# Patient Record
Sex: Male | Born: 1990 | Hispanic: No | Marital: Single | Smoking: Former smoker
Health system: Southern US, Community
[De-identification: ages and names within clinical notes are randomized; demographics above are authoritative.]

## PROBLEM LIST (undated history)

## (undated) DIAGNOSIS — F419 Anxiety disorder, unspecified: Secondary | ICD-10-CM

## (undated) DIAGNOSIS — N189 Chronic kidney disease, unspecified: Secondary | ICD-10-CM

## (undated) DIAGNOSIS — R011 Cardiac murmur, unspecified: Secondary | ICD-10-CM

## (undated) DIAGNOSIS — R51 Headache: Secondary | ICD-10-CM

## (undated) DIAGNOSIS — F319 Bipolar disorder, unspecified: Secondary | ICD-10-CM

## (undated) DIAGNOSIS — F32A Depression, unspecified: Secondary | ICD-10-CM

## (undated) DIAGNOSIS — R519 Headache, unspecified: Secondary | ICD-10-CM

## (undated) DIAGNOSIS — F329 Major depressive disorder, single episode, unspecified: Secondary | ICD-10-CM

## (undated) HISTORY — PX: ORCHIOPEXY: SHX479

## (undated) HISTORY — PX: WISDOM TOOTH EXTRACTION: SHX21

## (undated) HISTORY — PX: OTHER SURGICAL HISTORY: SHX169

---
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
## 2011-01-26 NOTE — Op Note (Signed)
Hayes Hayes           ACCOUNT NO.:  [id number]   MEDICAL RECORD NO.:  [id number]          PATIENT TYPE:  AMB   LOCATION:  DSC                          FACILITY:  [facility]   PHYSICIAN:  Hewitt Blade, D.D.S.DATE OF BIRTH:  17-Mar-1991   DATE OF PROCEDURE:  03/05/2009  DATE OF DISCHARGE:                               OPERATIVE REPORT   PREOPERATIVE DIAGNOSES:  1. Maxillary and mandibular impacted third molar teeth, #1, 16, 17,      32.  2. History of chronic depression and anxiety.  3. History of intraventricular conduction delay.   POSTOPERATIVE DIAGNOSES:  1. Maxillary and mandibular impacted third molar teeth, #1, 16, 17,      32.  2. History of chronic depression and anxiety.  3. History of intraventricular conduction delay.   SURGERY PERFORMED:  Removal of third molar teeth #1, 16, 17, and 32.   SURGEON:  Hewitt Blade, DDS   FIRST ASSISTANT:  Estill Batten.   ANESTHESIA:  General via oral endotracheal intubation.   ESTIMATED BLOOD LOSS:  Less than 10 mL.   FLUID REPLACEMENT:  Approximately 1000 mL of crystalloid solutions.   COMPLICATIONS:  None apparent.   INDICATIONS FOR PROCEDURE:  Hayes Hayes is a 20 year old gentleman who  is referred to my office for evaluation and removal of his third molar  teeth #1, 16, 17, and 32.  Upon evaluation of his medical history, Hayes Hayes is currently being treated with multiple psychotropic  medications to control anxiety, depression, and suicidal attempts.  Following consultation with his physician and psychiatrist, cessation of  the medications would be detrimental to his health and also with the  intravenous conduction delay, it was recommended that along with the  medications taking, the procedure should be carried out in a hospital  setting with continuous monitoring by the anesthesiologist.   DETAILS OF PROCEDURE:  On March 05, 2009, Hayes Hayes was taken to [facility] where he  was placed on the operating room  table in a supine position.  Following successful general anesthesia and  oral intubation, the patient's face, neck, and oral cavity were prepped  and draped in the usual sterile operating room fashion.  The hypopharynx  was suctioned free of fluids and secretions, and a moistened 2-inch  vaginal pack was placed as a drape pack.   Attention was then directed intraorally, where approximately 8 mL of  0.5% Xylocaine containing 1:200,000 epinephrine were infiltrated in the  right and left maxillary, posterior superior alveolar nerve  distributions, corresponding palatal soft tissues, and right left  inferior alveolar neurovascular regions.   Attention was then directed towards the right maxillary arch, where a  #15 Bard-Parker blade was used to create a 1.5 curvilinear incision  through the mucosal tissues over the distal alveolus.  A full-thickness  mucoperiosteal flap was then elevated using a #9 periosteal elevator and  the overlying cortical bone was exposed.  The bone was then reduced  using a Stryker roto-osteotome with a #8 round bur and a periosteal  elevator.  Embedded tooth #1 was identified and then subluxated from the  alveolus using a 301 elevator and removed from the oral cavity using  rongeurs and forceps.  The surrounding dental follicular tissue was  curetted using a double-ended mouth curette and removed with rongeurs  and forceps.  The bony margins were then smoothed and contoured with a  small osseous file.  Surgical site was then copiously irrigated with  sterile saline irrigating solutions and suctioned.  The mucoperiosteal  margins were then approximated and sutured in an interrupted fashion  using 4-0 chromic suture material.  In a similar fashion, tooth #16 was  removed as well.   Tooth #17 was then approached using a #15 Bard-Parker blade with a 1.5  curvilinear incision being made through the mucosal tissues.  Mucoperiosteal  flap was then elevated laterally and inferiorly using a  #9 periosteal elevator.  The overlying cortical bone was then reduced  using a Stryker roto-osteotome and a #8 round bur.  Tooth #17 was then  suctioned in its vertical axis using a Stryker roto-osteotome and a 107  fissure bur.  The tooth was then subluxated from the alveolus using a  301 elevator and the suctions were removed using rongeurs and forceps.  The surrounding dental follicular tissue was then curetted and removed  using a double-ended mouth curette.  The bony margins were then smoothed  and contoured using the Stryker roto-osteotome and a #8 round bur.  The  area was then thoroughly irrigated with sterile saline irrigating  solution and suctioned.  A Gelfoam/Maxitrol pack was then placed into  the extraction site.  The mucoperiosteal margins were then approximated  and sutured in an interrupted fashion using 4-0 chromic suture material.  In a similar fashion, tooth #32 was removed.   The oral cavity was then thoroughly irrigated with sterile saline  irrigating solution and suctioned using a Yankauer suction.  The throat  pack was removed, and the hypopharynx suctioned free of fluids and  secretions.  4x4 gauzes were then placed to control oozing.  The patient  was then allowed to awaken from the anesthesia and taken to the recovery  room where he tolerated the procedure well and without apparent  complications.           ______________________________  Hewitt Blade, D.D.S.     DC/MEDQ  D:  03/05/2009  T:  03/06/2009  Job:  [id number]   cc:   Holley Bouche, M.D.
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
## 2011-06-18 LAB — DIFFERENTIAL
Basophils Absolute: 0 10*3/uL (ref 0.0–0.1)
Basophils Relative: 1 % (ref 0–1)
Eosinophils Absolute: 0.4 10*3/uL (ref 0.0–1.2)
Monocytes Absolute: 0.6 10*3/uL (ref 0.2–1.2)
Monocytes Relative: 10 % (ref 3–11)
Neutro Abs: 2.3 10*3/uL (ref 1.7–8.0)

## 2011-06-18 LAB — CBC
Hemoglobin: 13.3 g/dL (ref 12.0–16.0)
MCHC: 33.8 g/dL (ref 31.0–37.0)
MCV: 90.8 fL (ref 78.0–98.0)
RDW: 12.7 % (ref 11.4–15.5)

## 2011-06-18 LAB — DRUGS OF ABUSE SCREEN W/O ALC, ROUTINE URINE
Cocaine Metabolites: NEGATIVE
Phencyclidine (PCP): NEGATIVE
Propoxyphene: NEGATIVE

## 2011-06-18 LAB — HEPATIC FUNCTION PANEL
ALT: 12 U/L (ref 0–53)
Alkaline Phosphatase: 78 U/L (ref 52–171)
Bilirubin, Direct: 0.1 mg/dL (ref 0.0–0.3)
Indirect Bilirubin: 0.5 mg/dL (ref 0.3–0.9)
Total Bilirubin: 0.6 mg/dL (ref 0.3–1.2)
Total Protein: 6.2 g/dL (ref 6.0–8.3)

## 2011-06-18 LAB — LIPID PANEL
LDL Cholesterol: 88 mg/dL (ref 0–109)
Total CHOL/HDL Ratio: 3 RATIO
VLDL: 13 mg/dL (ref 0–40)

## 2011-06-18 LAB — HEMOGLOBIN A1C: Hgb A1c MFr Bld: 5.6 % (ref 4.6–6.1)

## 2011-06-18 LAB — URINALYSIS, ROUTINE W REFLEX MICROSCOPIC
Ketones, ur: NEGATIVE mg/dL
Nitrite: NEGATIVE
Protein, ur: NEGATIVE mg/dL

## 2011-06-18 LAB — RPR: RPR Ser Ql: NONREACTIVE

## 2011-06-18 LAB — BASIC METABOLIC PANEL
CO2: 28 mEq/L (ref 19–32)
Calcium: 9.6 mg/dL (ref 8.4–10.5)
Chloride: 105 mEq/L (ref 96–112)
Creatinine, Ser: 1.03 mg/dL (ref 0.4–1.5)
Glucose, Bld: 80 mg/dL (ref 70–99)
Sodium: 140 mEq/L (ref 135–145)

## 2011-06-18 LAB — TSH: TSH: 2.797 u[IU]/mL (ref 0.350–4.500)

## 2013-08-21 ENCOUNTER — Ambulatory Visit: Payer: Self-pay | Admitting: *Deleted

## 2013-12-10 ENCOUNTER — Other Ambulatory Visit: Payer: Self-pay | Admitting: Family Medicine

## 2013-12-10 ENCOUNTER — Ambulatory Visit
Admission: RE | Admit: 2013-12-10 | Discharge: 2013-12-10 | Disposition: A | Discharge status: Home / Self Care | Payer: Federal, State, Local not specified - PPO | Source: Ambulatory Visit | Attending: Family Medicine | Admitting: Family Medicine

## 2013-12-10 DIAGNOSIS — R310 Gross hematuria: Secondary | ICD-10-CM

## 2014-09-04 ENCOUNTER — Ambulatory Visit
Admission: RE | Admit: 2014-09-04 | Discharge: 2014-09-04 | Disposition: A | Discharge status: Home / Self Care | Payer: Federal, State, Local not specified - PPO | Source: Ambulatory Visit | Attending: Family Medicine | Admitting: Family Medicine

## 2014-09-04 ENCOUNTER — Other Ambulatory Visit: Payer: Self-pay | Admitting: Family Medicine

## 2014-09-04 DIAGNOSIS — R319 Hematuria, unspecified: Secondary | ICD-10-CM

## 2014-09-04 DIAGNOSIS — R109 Unspecified abdominal pain: Secondary | ICD-10-CM

## 2014-09-05 ENCOUNTER — Emergency Department (HOSPITAL_COMMUNITY): Payer: Federal, State, Local not specified - PPO | Admitting: Certified Registered Nurse Anesthetist

## 2014-09-05 ENCOUNTER — Ambulatory Visit: Admit: 2014-09-05 | Payer: Federal, State, Local not specified - PPO | Admitting: Urology

## 2014-09-05 ENCOUNTER — Encounter (HOSPITAL_COMMUNITY): Payer: Self-pay | Admitting: Emergency Medicine

## 2014-09-05 ENCOUNTER — Emergency Department (HOSPITAL_COMMUNITY)
Admission: EM | Admit: 2014-09-05 | Discharge: 2014-09-05 | Disposition: A | Discharge status: Home / Self Care | Payer: Federal, State, Local not specified - PPO | Attending: Urology | Admitting: Urology

## 2014-09-05 ENCOUNTER — Encounter (HOSPITAL_COMMUNITY)
Admission: EM | Disposition: A | Discharge status: Home / Self Care | Payer: Self-pay | Source: Home / Self Care | Attending: Emergency Medicine

## 2014-09-05 DIAGNOSIS — F319 Bipolar disorder, unspecified: Secondary | ICD-10-CM | POA: Diagnosis not present

## 2014-09-05 DIAGNOSIS — F909 Attention-deficit hyperactivity disorder, unspecified type: Secondary | ICD-10-CM | POA: Diagnosis not present

## 2014-09-05 DIAGNOSIS — R310 Gross hematuria: Secondary | ICD-10-CM | POA: Insufficient documentation

## 2014-09-05 DIAGNOSIS — N132 Hydronephrosis with renal and ureteral calculous obstruction: Secondary | ICD-10-CM | POA: Diagnosis not present

## 2014-09-05 DIAGNOSIS — N200 Calculus of kidney: Secondary | ICD-10-CM | POA: Diagnosis present

## 2014-09-05 DIAGNOSIS — N23 Unspecified renal colic: Secondary | ICD-10-CM

## 2014-09-05 HISTORY — PX: CYSTOSCOPY WITH RETROGRADE PYELOGRAM, URETEROSCOPY AND STENT PLACEMENT: SHX5789

## 2014-09-05 LAB — CBC WITH DIFFERENTIAL/PLATELET
BASOS PCT: 0 % (ref 0–1)
Basophils Absolute: 0 10*3/uL (ref 0.0–0.1)
EOS ABS: 0.1 10*3/uL (ref 0.0–0.7)
Eosinophils Relative: 1 % (ref 0–5)
HEMATOCRIT: 44.3 % (ref 39.0–52.0)
HEMOGLOBIN: 15.2 g/dL (ref 13.0–17.0)
Lymphocytes Relative: 20 % (ref 12–46)
Lymphs Abs: 2 10*3/uL (ref 0.7–4.0)
MCH: 30.3 pg (ref 26.0–34.0)
MCHC: 34.3 g/dL (ref 30.0–36.0)
MCV: 88.4 fL (ref 78.0–100.0)
MONO ABS: 1.3 10*3/uL — AB (ref 0.1–1.0)
MONOS PCT: 13 % — AB (ref 3–12)
NEUTROS ABS: 6.4 10*3/uL (ref 1.7–7.7)
Neutrophils Relative %: 66 % (ref 43–77)
Platelets: 265 10*3/uL (ref 150–400)
RBC: 5.01 MIL/uL (ref 4.22–5.81)
RDW: 12.7 % (ref 11.5–15.5)
WBC: 9.7 10*3/uL (ref 4.0–10.5)

## 2014-09-05 LAB — BASIC METABOLIC PANEL
Anion gap: 7 (ref 5–15)
BUN: 13 mg/dL (ref 6–23)
CHLORIDE: 104 meq/L (ref 96–112)
CO2: 25 mmol/L (ref 19–32)
CREATININE: 1.4 mg/dL — AB (ref 0.50–1.35)
Calcium: 9.4 mg/dL (ref 8.4–10.5)
GFR calc non Af Amer: 70 mL/min — ABNORMAL LOW (ref >90)
GFR, EST AFRICAN AMERICAN: 81 mL/min — AB (ref >90)
Glucose, Bld: 137 mg/dL — ABNORMAL HIGH (ref 70–99)
Potassium: 4.2 mmol/L (ref 3.5–5.1)
Sodium: 136 mmol/L (ref 135–145)

## 2014-09-05 SURGERY — CYSTOURETEROSCOPY, WITH RETROGRADE PYELOGRAM AND STENT INSERTION
Anesthesia: General | Laterality: Right

## 2014-09-05 MED ORDER — PROMETHAZINE HCL 25 MG/ML IJ SOLN
6.2500 mg | INTRAMUSCULAR | Status: DC | PRN
  Filled 2014-09-05: qty 1

## 2014-09-05 MED ORDER — PROPOFOL 10 MG/ML IV BOLUS
INTRAVENOUS | Status: AC
  Filled 2014-09-05: qty 20

## 2014-09-05 MED ORDER — FENTANYL CITRATE 0.05 MG/ML IJ SOLN
INTRAMUSCULAR | Status: DC | PRN
  Administered 2014-09-05 (×2): 50 ug via INTRAVENOUS

## 2014-09-05 MED ORDER — LACTATED RINGERS IV SOLN: INTRAVENOUS | Status: DC

## 2014-09-05 MED ORDER — FENTANYL CITRATE 0.05 MG/ML IJ SOLN: 25.0000 ug | INTRAMUSCULAR | Status: DC | PRN

## 2014-09-05 MED ORDER — CEFAZOLIN SODIUM-DEXTROSE 2-3 GM-% IV SOLR
INTRAVENOUS | Status: AC
  Filled 2014-09-05: qty 50

## 2014-09-05 MED ORDER — HYDROMORPHONE HCL 1 MG/ML IJ SOLN
2.0000 mg | INTRAMUSCULAR | Status: DC | PRN
  Administered 2014-09-05: 2 mg via INTRAVENOUS
  Filled 2014-09-05: qty 2

## 2014-09-05 MED ORDER — LACTATED RINGERS IV SOLN: INTRAVENOUS | Status: DC | PRN

## 2014-09-05 MED ORDER — KETOROLAC TROMETHAMINE 30 MG/ML IJ SOLN
INTRAMUSCULAR | Status: DC | PRN
  Administered 2014-09-05: 30 mg via INTRAVENOUS

## 2014-09-05 MED ORDER — BELLADONNA ALKALOIDS-OPIUM 16.2-60 MG RE SUPP
RECTAL | Status: DC | PRN
  Administered 2014-09-05: 1 via RECTAL

## 2014-09-05 MED ORDER — IOHEXOL 300 MG/ML  SOLN
INTRAMUSCULAR | Status: DC | PRN
  Administered 2014-09-05: 10 mL

## 2014-09-05 MED ORDER — CEFAZOLIN SODIUM-DEXTROSE 2-3 GM-% IV SOLR
INTRAVENOUS | Status: DC | PRN
  Administered 2014-09-05: 2 g via INTRAVENOUS

## 2014-09-05 MED ORDER — DEXAMETHASONE SODIUM PHOSPHATE 10 MG/ML IJ SOLN
INTRAMUSCULAR | Status: AC
  Filled 2014-09-05: qty 1

## 2014-09-05 MED ORDER — OXYCODONE-ACETAMINOPHEN 5-325 MG PO TABS: 1.0000 | ORAL_TABLET | ORAL | Status: DC | PRN

## 2014-09-05 MED ORDER — MEPERIDINE HCL 50 MG/ML IJ SOLN: 6.2500 mg | INTRAMUSCULAR | Status: DC | PRN

## 2014-09-05 MED ORDER — MIDAZOLAM HCL 5 MG/5ML IJ SOLN
INTRAMUSCULAR | Status: DC | PRN
  Administered 2014-09-05: 2 mg via INTRAVENOUS

## 2014-09-05 MED ORDER — STERILE WATER FOR IRRIGATION IR SOLN
Status: DC | PRN
  Administered 2014-09-05: 3000 mL

## 2014-09-05 MED ORDER — PROPOFOL 10 MG/ML IV BOLUS
INTRAVENOUS | Status: DC | PRN
  Administered 2014-09-05: 200 mg via INTRAVENOUS

## 2014-09-05 MED ORDER — ACETAMINOPHEN 10 MG/ML IV SOLN
1000.0000 mg | Freq: Once | INTRAVENOUS | Status: AC
  Administered 2014-09-05: 1000 mg via INTRAVENOUS
  Filled 2014-09-05: qty 100

## 2014-09-05 MED ORDER — MIDAZOLAM HCL 2 MG/2ML IJ SOLN
INTRAMUSCULAR | Status: AC
  Filled 2014-09-05: qty 2

## 2014-09-05 MED ORDER — LIDOCAINE HCL (CARDIAC) 20 MG/ML IV SOLN
INTRAVENOUS | Status: DC | PRN
  Administered 2014-09-05: 100 mg via INTRAVENOUS

## 2014-09-05 MED ORDER — SUCCINYLCHOLINE CHLORIDE 20 MG/ML IJ SOLN
INTRAMUSCULAR | Status: DC | PRN
  Administered 2014-09-05: 100 mg via INTRAVENOUS

## 2014-09-05 MED ORDER — ONDANSETRON HCL 4 MG/2ML IJ SOLN
INTRAMUSCULAR | Status: DC | PRN
  Administered 2014-09-05: 4 mg via INTRAVENOUS

## 2014-09-05 MED ORDER — KETOROLAC TROMETHAMINE 30 MG/ML IJ SOLN
INTRAMUSCULAR | Status: AC
  Filled 2014-09-05: qty 1

## 2014-09-05 MED ORDER — ONDANSETRON HCL 4 MG/2ML IJ SOLN
INTRAMUSCULAR | Status: AC
  Filled 2014-09-05: qty 2

## 2014-09-05 MED ORDER — FENTANYL CITRATE 0.05 MG/ML IJ SOLN
INTRAMUSCULAR | Status: AC
  Filled 2014-09-05: qty 5

## 2014-09-05 MED ORDER — LIDOCAINE HCL (CARDIAC) 20 MG/ML IV SOLN
INTRAVENOUS | Status: AC
  Filled 2014-09-05: qty 5

## 2014-09-05 MED ORDER — ROCURONIUM BROMIDE 100 MG/10ML IV SOLN
INTRAVENOUS | Status: DC | PRN
  Administered 2014-09-05: 5 mg via INTRAVENOUS

## 2014-09-05 MED ORDER — ONDANSETRON HCL 4 MG/2ML IJ SOLN
4.0000 mg | Freq: Four times a day (QID) | INTRAMUSCULAR | Status: DC | PRN
  Administered 2014-09-05: 4 mg via INTRAVENOUS
  Filled 2014-09-05: qty 2

## 2014-09-05 MED ORDER — SODIUM CHLORIDE 0.9 % IV SOLN
INTRAVENOUS | Status: DC | PRN
  Administered 2014-09-05: 19:00:00 via INTRAVENOUS

## 2014-09-05 MED ORDER — URIBEL 118 MG PO CAPS: 1.0000 | ORAL_CAPSULE | Freq: Three times a day (TID) | ORAL | Status: DC | PRN

## 2014-09-05 MED ORDER — SODIUM CHLORIDE 0.9 % IV SOLN
INTRAVENOUS | Status: DC
  Administered 2014-09-05: 13:00:00 via INTRAVENOUS

## 2014-09-05 MED ORDER — BELLADONNA ALKALOIDS-OPIUM 16.2-60 MG RE SUPP
RECTAL | Status: AC
  Filled 2014-09-05: qty 1

## 2014-09-05 MED ORDER — DEXAMETHASONE SODIUM PHOSPHATE 10 MG/ML IJ SOLN
INTRAMUSCULAR | Status: DC | PRN
  Administered 2014-09-05: 10 mg via INTRAVENOUS

## 2014-09-05 SURGICAL SUPPLY — 13 items
BAG URO CATCHER STRL LF (DRAPE) ×2 IMPLANT
CATH INTERMIT  6FR 70CM (CATHETERS) ×2 IMPLANT
CLOTH BEACON ORANGE TIMEOUT ST (SAFETY) ×2 IMPLANT
DRAPE CAMERA CLOSED 9X96 (DRAPES) ×2 IMPLANT
GLOVE BIOGEL M STRL SZ7.5 (GLOVE) ×2 IMPLANT
GOWN STRL REUS W/TWL XL LVL3 (GOWN DISPOSABLE) ×2 IMPLANT
GUIDEWIRE STR DUAL SENSOR (WIRE) ×2 IMPLANT
MANIFOLD NEPTUNE II (INSTRUMENTS) ×2 IMPLANT
NS IRRIG 1000ML POUR BTL (IV SOLUTION) ×2 IMPLANT
PACK CYSTO (CUSTOM PROCEDURE TRAY) ×2 IMPLANT
SCRUB PCMX 4 OZ (MISCELLANEOUS) IMPLANT
STENT CONTOUR 6FRX24X.038 (STENTS) ×2 IMPLANT
TUBING CONNECTING 10 (TUBING) ×2 IMPLANT

## 2014-09-05 NOTE — H&P (Signed)
History and Physical  Chief Complaint:  Gross hematuria and right sided kidney stone  History of Present Illness:   The patient is a 23 year old male, with a one-week history of gross hematuria, and intermittent right flank pain. Patient was seen by his primary care physician, with CT scan and [place] showing a 1 cm right upper ureteral calculus with obstruction and proximal hydroureteronephrosis. He was referred to a no vomit urologist in [place], and was given an appointment for Monday for follow-up. However, the patient's father desired for the patient to have evaluation sooner than that, and the patient's personal physician called [facility] for evaluation. After evaluation of CT scan, the patient was advised to be seen [facility] emergency room. It is noted that this morning he had severe renal colic, with nausea and vomiting. He is taken hydrocodone today, but did eat a small amount of 11 AM.  His past history is significant for ADHD and bipolar disorder. 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   PMH: ADHD, Bipolar.    History reviewed. No pertinent past surgical history.  Medications: I have reviewed the patient's current medications. Allergies: No Known Allergies  No family history on file. Social History:  has no tobacco, alcohol, and drug history on file.  ROS: All systems are reviewed and negative except as noted. Right flank pain. No RLQ pain.  N/V this AM.   Physical Exam:  Vital signs in last 24 hours: Temp:  [98.1 F (36.7 C)] 98.1 F (36.7 C) (12/24 1123) Pulse Rate:  [71-75] 75 (12/24 1304) Resp:  [16-18] 18 (12/24 1304) BP: (128-137)/(57-71) 137/71 mmHg (12/24 1304) SpO2:  [96 %-100 %] 100 % (12/24 1304)  Cardiovascular: Skin warm; not flushed Respiratory: Breaths quiet; no shortness of breath Abdomen: No masses Neurological: Normal sensation to touch Musculoskeletal: Normal motor function arms  and legs Lymphatics: No inguinal adenopathy Skin: No rashes Genitourinary: normal.    Laboratory Data:  Results for orders placed or performed during the hospital encounter of 09/05/14 (from the past 24 hour(s))  CBC with Differential     Status: Abnormal   Collection Time: 09/05/14 12:55 PM  Result Value Ref Range   WBC 9.7 4.0 - 10.5 K/uL   RBC 5.01 4.22 - 5.81 MIL/uL   Hemoglobin 15.2 13.0 - 17.0 g/dL   HCT 40.944.3 81.139.0 - 91.452.0 %   MCV 88.4 78.0 - 100.0 fL   MCH 30.3 26.0 - 34.0 pg   MCHC 34.3 30.0 - 36.0 g/dL   RDW 78.212.7 95.611.5 - 21.315.5 %   Platelets 265 150 - 400 K/uL   Neutrophils Relative % 66 43 - 77 %   Neutro Abs 6.4 1.7 - 7.7 K/uL   Lymphocytes Relative 20 12 - 46 %   Lymphs Abs 2.0 0.7 - 4.0 K/uL   Monocytes Relative 13 (H) 3 - 12 %   Monocytes Absolute 1.3 (H) 0.1 - 1.0 K/uL   Eosinophils Relative 1 0 - 5 %   Eosinophils Absolute 0.1 0.0 - 0.7 K/uL   Basophils Relative 0 0 - 1 %   Basophils Absolute 0.0 0.0 - 0.1 K/uL  Basic metabolic panel     Status: Abnormal   Collection Time: 09/05/14 12:55 PM  Result Value Ref Range   Sodium 136 135 - 145 mmol/L   Potassium 4.2 3.5 - 5.1 mmol/L   Chloride 104 96 - 112 mEq/L   CO2 25 19 - 32  mmol/L   Glucose, Bld 137 (H) 70 - 99 mg/dL   BUN 13 6 - 23 mg/dL   Creatinine, Ser 1.611.40 (H) 0.50 - 1.35 mg/dL   Calcium 9.4 8.4 - 09.610.5 mg/dL   GFR calc non Af Amer 70 (L) >90 mL/min   GFR calc Af Amer 81 (L) >90 mL/min   Anion gap 7 5 - 15   No results found for this or any previous visit (from the past 240 hour(s)). Creatinine:  Recent Labs  09/05/14 1255  CREATININE 1.40*    Xrays: CLINICAL DATA: Right-sided flank pain for 5 days. History of microscopic hematuria.  EXAM: CT ABDOMEN AND PELVIS WITHOUT CONTRAST  TECHNIQUE: Multidetector CT imaging of the abdomen and pelvis was performed following the standard protocol without IV contrast.  COMPARISON: None.  FINDINGS: The lack of intravenous contrast limits the  ability to evaluate solid abdominal organs.  There is a punctate (approximately 1.0 x 0.7 cm) stone within the superior aspect of the right ureter (coronal image 68, series 400) which results in moderate upstream ureterectasis and pelvicaliectasis. No additional renal stones identified. No evidence of left-sided urinary obstruction. There is a minimal amount of asymmetric left-sided periureteral stranding. Normal noncontrast appearance of the urinary bladder given degree distention.  Normal hepatic contour. Normal noncontrast appearance of the gallbladder. No radiopaque gallstones. No ascites.  Normal noncontrast appearance of the bilateral adrenal glands, pancreas and spleen.  Moderate to large colonic stool burden without evidence of enteric obstruction. Normal noncontrast appearance of the appendix. No pneumoperitoneum, pneumatosis or portal venous gas.  Normal caliber of the abdominal aorta.  No bulky retroperitoneal, mesenteric, pelvic or inguinal lymphadenopathy on this noncontrast examination.  Normal noncontrast appearance of the pelvic organs. No free fluid in the pelvic cul-de-sac.  Limited visualization of the lower thorax demonstrates minimal subsegmental atelectasis within the imaged right lower lobe. No focal airspace opacities. No pleural effusion.  Normal heart size. No pericardial effusion.  No acute or aggressive osseous abnormalities. Regional soft tissues appear normal.  IMPRESSION: 1. An approximately 1 cm stone within the superior aspect of the right ureter results an moderate upstream right-sided ureterectasis and pelvicaliectasis. 2. No additional renal stones identified. No evidence of left-sided urinary obstruction. These results will be called to the ordering clinician or representative by the Radiologist Assistant, and communication documented in the PACS or zVision Dashboard.   Electronically Signed  By: Simonne ComeJohn Watts M.D.   On: 09/04/2014 17:49  Impression/Assessment:  Right upper ureteral stone, 1.0 cm, with proximal hydroureteronephrosis. 841 HU.  I discussed this with the patient, and his father. Note that he has recurrent colic, with history of gross hematuria. I advised him to have right-sided double-J stent placement today as an outpatient. He will then follow-up with K[facility], and lithotripsy next week.  Plan:    Cystoscopy and Right JJ stent today.   [place] Welcome I 09/05/2014, 1:43 PM

## 2014-09-05 NOTE — ED Notes (Signed)
Called OR and checked on  Surgery schedule. They replied that surgery will be after 7 pm today.

## 2014-09-05 NOTE — Anesthesia Postprocedure Evaluation (Signed)
  Anesthesia Post-op Note  Patient: Tony Hayes  Procedure(s) Performed: Procedure(s) (LRB): CYSTOSCOPY WITH RIGHT RETROGRADE PYELOGRAM AND STENT PLACEMENT (Right)  Patient Location: PACU  Anesthesia Type: General  Level of Consciousness: awake and alert   Airway and Oxygen Therapy: Patient Spontanous Breathing  Post-op Pain: mild  Post-op Assessment: Post-op Vital signs reviewed, Patient's Cardiovascular Status Stable, Respiratory Function Stable, Patent Airway and No signs of Nausea or vomiting  Last Vitals:  Filed Vitals:   09/05/14 2110  BP:   Pulse: 64  Temp: 36.7 C  Resp: 12    Post-op Vital Signs: stable   Complications: No apparent anesthesia complications

## 2014-09-05 NOTE — ED Notes (Signed)
Pt reports right flank pain for 1 week for which pt saw urologist and Dx with kidney stones. Pt also reports urinary difficulties and hematuria.

## 2014-09-05 NOTE — Progress Notes (Signed)
PACU note---pt up to BR, voided, IV d/c'ed

## 2014-09-05 NOTE — Progress Notes (Signed)
  CARE MANAGEMENT ED NOTE 09/05/2014  Patient:  Tony Hayes,Tony Hayes   Account Number:  [id number]  Date Initiated:  09/05/2014  Documentation initiated by:  Radford PaxFERRERO,Kitzia Camus  Subjective/Objective Assessment:   Patient presents to Ed with right sided flank pain/kidney stone     Subjective/Objective Assessment Detail:     Action/Plan:   Action/Plan Detail:   Anticipated DC Date:       Status Recommendation to Physician:   Result of Recommendation:    Other ED Services  Consult Working Plan    DC Planning Services  Other  PCP issues    Choice offered to / List presented to:            Status of service:  Completed, signed off  ED Comments:   ED Comments Detail:  EDCM spoke to patient and hhis family at bedside. Patient confirms he has BCBS insurnace.  Patient's has a new pcp at [facility] but cannot remember his name.  Patient also reports he has seen Dr. Patsi Searsannenbaum at [facility]. System updated.  No further EDCm needs at this time.

## 2014-09-05 NOTE — Discharge Instructions (Signed)

## 2014-09-05 NOTE — Transfer of Care (Signed)
Immediate Anesthesia Transfer of Care Note  Patient: Tony BossJonathan K Cotterill  Procedure(s) Performed: Procedure(s): CYSTOSCOPY WITH RIGHT RETROGRADE PYELOGRAM AND STENT PLACEMENT (Right)  Patient Location: PACU  Anesthesia Type:General  Level of Consciousness: awake, alert  and patient cooperative  Airway & Oxygen Therapy: Patient Spontanous Breathing and Patient connected to face mask oxygen  Post-op Assessment: Report given to PACU RN, Post -op Vital signs reviewed and stable and Patient moving all extremities X 4  Post vital signs: stable  Complications: No apparent anesthesia complications

## 2014-09-05 NOTE — ED Provider Notes (Signed)
CSN: [id number]     Arrival date & time 09/05/14  1106 History   First MD Initiated Contact with Patient 09/05/14 1155     Chief Complaint  Patient presents with  . Nephrolithiasis     (Consider location/radiation/quality/duration/timing/severity/associated sxs/prior Treatment) HPI Comments: Patient here complaining of one week of right-sided flank pain. Diagnosed with a 1 cm right-sided kidney stone and sent here to be seen by the urologist. States his pain is controlled this time as he took hydromorphone prior to arrival. No vomiting or fever noted.  The history is provided by the patient and a parent.    History reviewed. No pertinent past medical history. History reviewed. No pertinent past surgical history. No family history on file. History  Substance Use Topics  . Smoking status: Not on file  . Smokeless tobacco: Not on file  . Alcohol Use: Not on file    Review of Systems  All other systems reviewed and are negative.     Allergies  Review of patient's allergies indicates not on file.  Home Medications   Prior to Admission medications   Not on File   BP 128/57 mmHg  Pulse 71  Temp(Src) 98.1 F (36.7 C) (Oral)  Resp 16  SpO2 96% Physical Exam  Constitutional: He is oriented to person, place, and time. He appears well-developed and well-nourished.  Non-toxic appearance. No distress.  HENT:  Head: Normocephalic and atraumatic.  Eyes: Conjunctivae, EOM and lids are normal. Pupils are equal, round, and reactive to light.  Neck: Normal range of motion. Neck supple. No tracheal deviation present. No thyroid mass present.  Cardiovascular: Normal rate, regular rhythm and normal heart sounds.  Exam reveals no gallop.   No murmur heard. Pulmonary/Chest: Effort normal and breath sounds normal. No stridor. No respiratory distress. He has no decreased breath sounds. He has no wheezes. He has no rhonchi. He has no rales.  Abdominal: Soft. Normal appearance and bowel  sounds are normal. He exhibits no distension. There is no tenderness. There is no rebound and no CVA tenderness.  Musculoskeletal: Normal range of motion. He exhibits no edema or tenderness.  Neurological: He is alert and oriented to person, place, and time. He has normal strength. No cranial nerve deficit or sensory deficit. GCS eye subscore is 4. GCS verbal subscore is 5. GCS motor subscore is 6.  Skin: Skin is warm and dry. No abrasion and no rash noted.  Psychiatric: He has a normal mood and affect. His speech is normal and behavior is normal.  Nursing note and vitals reviewed.   ED Course  Procedures (including critical care time) Labs Review Labs Reviewed  CBC WITH DIFFERENTIAL  BASIC METABOLIC PANEL    Imaging Review Ct Abdomen Pelvis Wo Contrast  09/04/2014   CLINICAL DATA:  Right-sided flank pain for 5 days. History of microscopic hematuria.  EXAM: CT ABDOMEN AND PELVIS WITHOUT CONTRAST  TECHNIQUE: Multidetector CT imaging of the abdomen and pelvis was performed following the standard protocol without IV contrast.  COMPARISON:  None.  FINDINGS: The lack of intravenous contrast limits the ability to evaluate solid abdominal organs.  There is a punctate (approximately 1.0 x 0.7 cm) stone within the superior aspect of the right ureter (coronal image 68, series 400) which results in moderate upstream ureterectasis and pelvicaliectasis. No additional renal stones identified. No evidence of left-sided urinary obstruction. There is a minimal amount of asymmetric left-sided periureteral stranding. Normal noncontrast appearance of the urinary bladder given degree distention.  Normal hepatic  contour. Normal noncontrast appearance of the gallbladder. No radiopaque gallstones. No ascites.  Normal noncontrast appearance of the bilateral adrenal glands, pancreas and spleen.  Moderate to large colonic stool burden without evidence of enteric obstruction. Normal noncontrast appearance of the appendix. No  pneumoperitoneum, pneumatosis or portal venous gas.  Normal caliber of the abdominal aorta.  No bulky retroperitoneal, mesenteric, pelvic or inguinal lymphadenopathy on this noncontrast examination.  Normal noncontrast appearance of the pelvic organs. No free fluid in the pelvic cul-de-sac.  Limited visualization of the lower thorax demonstrates minimal subsegmental atelectasis within the imaged right lower lobe. No focal airspace opacities. No pleural effusion.  Normal heart size.  No pericardial effusion.  No acute or aggressive osseous abnormalities. Regional soft tissues appear normal.  IMPRESSION: 1. An approximately 1 cm stone within the superior aspect of the right ureter results an moderate upstream right-sided ureterectasis and pelvicaliectasis. 2. No additional renal stones identified. No evidence of left-sided urinary obstruction. These results will be called to the ordering clinician or representative by the Radiologist Assistant, and communication documented in the PACS or zVision Dashboard.   Electronically Signed   By: Simonne ComeJohn  Watts M.D.   On: 09/04/2014 17:49     EKG Interpretation None      MDM   Final diagnoses:  None     Preoperative blood work ordered here. Patient's pain is controlled he does not require any medications. Spoke with the urologist on call, Dr. Patsi Searsannenbaum, will come to see the patient taken to the operating theater    Toy BakerAnthony T Tayven Renteria, MD 09/05/14 1223

## 2014-09-05 NOTE — Anesthesia Preprocedure Evaluation (Addendum)

## 2014-09-05 NOTE — ED Notes (Addendum)
Per Patsi Searsannenbaum MD, Pt will be going to surgery around 5:30.  Pt will not be admitted, so he will have to hold in the department.

## 2014-09-05 NOTE — Op Note (Signed)
Pre-operative diagnosis :   Impacted right upper ureteral calculus with proximal hydroureteronephrosis, and recurrent renal colic  Postoperative diagnosis:  Same  Operation:  Cystourethroscopy, right retrograde pyelogram interpretation, right double-J stent (6 [place]French by 24 cm).  Surgeon:  Kathie RhodesS. Patsi Searsannenbaum, MD  First assistant:  None  Anesthesia:  General LMA  Preparation:  After appropriate preanesthesia, the patient was brought the operating room, placed on the operating table in the dorsal supine position where general LMA anesthesia was introduced. He was replaced in the dorsal lithotomy position where the pubis was prepped with Betadine solution and draped in usual fashion. The right arm was previously marked. The history was double checked. X-rays had been previously double checked.  Review history:   History of Present Illness: The patient is a 23 year old male, with a one-week history of gross hematuria, and intermittent right flank pain. Patient was seen by his primary care physician, [facility] showing a 1 cm right upper ureteral calculus with obstruction and proximal hydroureteronephrosis. He was referred to [place], and was given an appointment for Monday for follow-up. However, the patient's father desired for the patient to have evaluation sooner than that, and the patient's personal physician called [facility] for evaluation. After evaluation of CT scan, the patient was advised to be [facility]. It is noted that this morning he had severe renal colic, with nausea and vomiting. He is taken hydrocodone today, but did eat a small amount of 11 AM.  Statement of  Likelihood of Success: Excellent. TIME-OUT observed.:  Procedure:  Cystourethroscopy was accomplished, and the bladder appeared normal. The trigone was normal. The ureteral orifices were in normal position on the trigone. There was no evidence of bladder  stone, tumor, or bladder diverticulum.  The 5 [place]French open-ended catheter was then placed within the right ureter, and retrograde pyelography under medication showed the right upper ureteral stone. A 0.038 guidewire was then passed around the stone, with some difficulty, and passed into the renal pelvis and coiled. The open-ended catheter was then removed, and a 6 [place] by 24 cm double-J stent was passed, again with some difficulty around the stone, and coiled in the renal pelvis. Photodocumentation was accomplished with radiographic control.  The patient received IV antibiotic, as well as be an dose suppository, IV Toradol, and IV Tylenol. He was awakened, and taken to recovery room in good condition.

## 2014-09-05 NOTE — Interval H&P Note (Signed)
History and Physical Interval Note:  09/05/2014 2:09 PM  Tony Hayes  has presented today for surgery, with the diagnosis of right renal obstruction, hydronephrosis  The various methods of treatment have been discussed with the patient and family. After consideration of risks, benefits and other options for treatment, the patient has consented to  Procedure(s): CYSTOSCOPY WITH RIGHT RETROGRADE PYELOGRAM AND STENT PLACEMENT (Right) as a surgical intervention .  The patient's history has been reviewed, patient examined, no change in status, stable for surgery.  I have reviewed the patient's chart and labs.  Questions were answered to the patient's satisfaction.   Patient ate biscuit and drank Dr. Reino KentPepper at 11:00 AM. Anesthesia will hold off surgery until 7 pm.   Patsi SearsANNENBAUM, Selim Durden I

## 2014-09-09 ENCOUNTER — Encounter (HOSPITAL_COMMUNITY): Payer: Self-pay | Admitting: Urology

## 2014-09-16 ENCOUNTER — Other Ambulatory Visit: Payer: Self-pay | Admitting: Urology

## 2014-09-17 ENCOUNTER — Encounter (HOSPITAL_COMMUNITY): Payer: Self-pay

## 2014-09-17 NOTE — Progress Notes (Signed)
Pre-lithotripsy:Pt was scheduled to come in to PST on 1/6 0800 for an EKG, his mother states he has a heart condition, she doesn't know name, he takes no meds for it and doesn't see a cardiologist for it.  As a precaution an EKG was scheduled.  Pt's mother is aware to report to Admitting at 0800 1/6.  Documented this also under pt education.  Pt's mother states his pychiatrist diagnosed the heart problem.  Could not find any documentation of the heart problem in Epic or other computer documentation.

## 2014-09-18 ENCOUNTER — Encounter (HOSPITAL_COMMUNITY)
Admission: RE | Admit: 2014-09-18 | Discharge: 2014-09-18 | Disposition: A | Discharge status: Home / Self Care | Payer: Federal, State, Local not specified - PPO | Source: Ambulatory Visit | Attending: Urology | Admitting: Urology

## 2014-09-18 DIAGNOSIS — F418 Other specified anxiety disorders: Secondary | ICD-10-CM | POA: Diagnosis not present

## 2014-09-18 DIAGNOSIS — I10 Essential (primary) hypertension: Secondary | ICD-10-CM | POA: Diagnosis not present

## 2014-09-18 DIAGNOSIS — R631 Polydipsia: Secondary | ICD-10-CM | POA: Diagnosis not present

## 2014-09-18 DIAGNOSIS — N201 Calculus of ureter: Secondary | ICD-10-CM | POA: Diagnosis present

## 2014-09-18 DIAGNOSIS — Z87442 Personal history of urinary calculi: Secondary | ICD-10-CM | POA: Diagnosis not present

## 2014-09-19 ENCOUNTER — Ambulatory Visit (HOSPITAL_COMMUNITY)
Admission: RE | Admit: 2014-09-19 | Discharge: 2014-09-19 | Disposition: A | Discharge status: Home / Self Care | Payer: Federal, State, Local not specified - PPO | Source: Ambulatory Visit | Attending: Urology | Admitting: Urology

## 2014-09-19 ENCOUNTER — Encounter (HOSPITAL_COMMUNITY)
Admission: RE | Disposition: A | Discharge status: Home / Self Care | Payer: Self-pay | Source: Ambulatory Visit | Attending: Urology

## 2014-09-19 ENCOUNTER — Encounter (HOSPITAL_COMMUNITY): Payer: Self-pay | Admitting: *Deleted

## 2014-09-19 ENCOUNTER — Ambulatory Visit (HOSPITAL_COMMUNITY): Payer: Federal, State, Local not specified - PPO

## 2014-09-19 DIAGNOSIS — N201 Calculus of ureter: Secondary | ICD-10-CM | POA: Insufficient documentation

## 2014-09-19 DIAGNOSIS — R631 Polydipsia: Secondary | ICD-10-CM | POA: Insufficient documentation

## 2014-09-19 DIAGNOSIS — Z87442 Personal history of urinary calculi: Secondary | ICD-10-CM | POA: Insufficient documentation

## 2014-09-19 DIAGNOSIS — I10 Essential (primary) hypertension: Secondary | ICD-10-CM | POA: Insufficient documentation

## 2014-09-19 DIAGNOSIS — F418 Other specified anxiety disorders: Secondary | ICD-10-CM | POA: Insufficient documentation

## 2014-09-19 HISTORY — DX: Bipolar disorder, unspecified: F31.9

## 2014-09-19 HISTORY — DX: Anxiety disorder, unspecified: F41.9

## 2014-09-19 HISTORY — DX: Depression, unspecified: F32.A

## 2014-09-19 HISTORY — DX: Cardiac murmur, unspecified: R01.1

## 2014-09-19 HISTORY — DX: Chronic kidney disease, unspecified: N18.9

## 2014-09-19 HISTORY — DX: Major depressive disorder, single episode, unspecified: F32.9

## 2014-09-19 SURGERY — LITHOTRIPSY, ESWL
Anesthesia: LOCAL | Laterality: Right

## 2014-09-19 MED ORDER — DIPHENHYDRAMINE HCL 25 MG PO CAPS
25.0000 mg | ORAL_CAPSULE | Freq: Once | ORAL | Status: AC
  Administered 2014-09-19: 25 mg via ORAL
  Filled 2014-09-19 (×2): qty 1

## 2014-09-19 MED ORDER — CIPROFLOXACIN HCL 500 MG PO TABS
500.0000 mg | ORAL_TABLET | Freq: Once | ORAL | Status: AC
  Administered 2014-09-19: 500 mg via ORAL
  Filled 2014-09-19: qty 1

## 2014-09-19 MED ORDER — DEXTROSE-NACL 5-0.45 % IV SOLN
INTRAVENOUS | Status: DC
Start: 2014-09-19 — End: 2014-09-19
  Administered 2014-09-19: 10:00:00 via INTRAVENOUS

## 2014-09-19 MED ORDER — DIAZEPAM 2 MG PO TABS: 10.0000 mg | ORAL_TABLET | Freq: Once | ORAL | Status: DC

## 2014-09-19 MED ORDER — DIAZEPAM 5 MG PO TABS
10.0000 mg | ORAL_TABLET | Freq: Once | ORAL | Status: AC
  Administered 2014-09-19: 10 mg via ORAL
  Filled 2014-09-19: qty 2

## 2014-09-19 NOTE — Interval H&P Note (Signed)
History and Physical Interval Note:  09/19/2014 8:27 AM  Tony Hayes  has presented today for surgery, with the diagnosis of right upj stone  The various methods of treatment have been discussed with the patient and family. After consideration of risks, benefits and other options for treatment, the patient has consented to  Procedure(s): RIGHT EXTRACORPOREAL SHOCK WAVE LITHOTRIPSY (ESWL) (Right) as a surgical intervention .  The patient's history has been reviewed, patient examined, no change in status, stable for surgery.  I have reviewed the patient's chart and labs.  Questions were answered to the patient's satisfaction.     Jethro BolusANNENBAUM, Kamauri Denardo I

## 2014-09-19 NOTE — H&P (Signed)
Reason For Visit KUB & hospital f/u   Active Problems Problems  1. Right ureteral stone (N20.1)  History of Present Illness 24 yo male presents today for a KUB & to discuss ESWL. He was seen in the ER on 09/05/14 for a 1 week hx of gross hematuria & intermittent Rt flank pain. Patient was originally evaluated by PCP & CT showed a 1cm Rt upper ureteral stone with obstruction. He had a cystoscopy/Rt RPG/Rt JJ stent placed on 09/05/14.   Past Medical History Problems  1. History of Anxiety (F41.9) 2. History of depression (Z86.59) 3. History of hypertension (Z86.79)  Surgical History Problems  1. History of Elective Circumcision 2. History of Surgery Testis 3. History of Surgery Testis Exploration Of Undescended Testis  Current Meds 1. Effexor TABS;  Therapy: (Recorded:30Dec2015) to Recorded 2. LaMICtal TABS;  Therapy: (Recorded:30Dec2015) to Recorded  Allergies Medication  1. No Known Drug Allergies  Social History Problems  1. Caffeine Use  Review of Systems Genitourinary, constitutional, skin, eye, otolaryngeal, hematologic/lymphatic, cardiovascular, pulmonary, endocrine, musculoskeletal, gastrointestinal, neurological and psychiatric system(s) were reviewed and pertinent findings if present are noted and are otherwise negative.  Genitourinary: urinary frequency, difficulty starting the urinary stream, weak urinary stream, hematuria and initiating urination requires straining.  Gastrointestinal: nausea.  Constitutional: feeling tired (fatigue).  ENT: sinus problems.  Endocrine: polydipsia.  Neurological: headache and dizziness.  Psychiatric: anxiety and depression.    Vitals Vital Signs [Data Includes: Last 1 Day]  Recorded: 30Dec2015 11:19AM  Height: 5 ft 10 in Weight: 204 lb  BMI Calculated: 29.27 BSA Calculated: 2.1 Blood Pressure: 122 / 74 Heart Rate: 84  Physical Exam Constitutional: Well nourished and well developed . No acute distress.  ENT:. The  ears and nose are normal in appearance.  Neck: The appearance of the neck is normal and no neck mass is present.  Pulmonary: No respiratory distress and normal respiratory rhythm and effort.  Cardiovascular: Heart rate and rhythm are normal . No peripheral edema.  Abdomen: The abdomen is soft and nontender. No masses are palpated. No CVA tenderness. No hernias are palpable. No hepatosplenomegaly noted.  Genitourinary: Examination of the penis demonstrates no discharge, no masses, no lesions and a normal meatus. The scrotum is without lesions. The right epididymis is palpably normal and non-tender. The left epididymis is palpably normal and non-tender. The right testis is non-tender and without masses. The left testis is non-tender and without masses.  Lymphatics: The femoral and inguinal nodes are not enlarged or tender.  Skin: Normal skin turgor, no visible rash and no visible skin lesions.  Neuro/Psych:. Mood and affect are appropriate.    Results/Data Urine [Data Includes: Last 1 Day]   30Dec2015  COLOR AMBER   APPEARANCE CLOUDY   SPECIFIC GRAVITY 1.025   pH 6.5   GLUCOSE NEG mg/dL  BILIRUBIN SMALL   KETONE NEG mg/dL  BLOOD LARGE   PROTEIN 100 mg/dL  UROBILINOGEN 1 mg/dL  NITRITE NEG   LEUKOCYTE ESTERASE MOD   SQUAMOUS EPITHELIAL/HPF NONE SEEN   WBC NONE SEEN WBC/hpf  RBC TNTC RBC/hpf  BACTERIA RARE   CRYSTALS NONE SEEN   CASTS NONE SEEN    KUB: Right UPJ stone. Seen on KUB. Discussed lithotripsy with pt and his father.   Assessment Assessed  1. Right ureteral stone (N20.1)  Right UPJ stone post Right JJ stent.   Plan Health Maintenance  1. UA With REFLEX; [Do Not Release]; Status:Resulted - Requires  Verification;   Done: 30Dec2015 10:45AM Right ureteral  stone  2. Follow-up Schedule Surgery Office  Follow-up  Status: Hold For -  Appointment  Requested for: 30Dec2015  Lithotripsy of Right UPJ stone. Continue Urobel. He has stent sensation. OK to go to gym.    Signatures Electronically signed by : Jethro BolusSigmund Avrianna Smart, M.D.; Sep 11 2014 11:53AM EST

## 2014-10-16 ENCOUNTER — Other Ambulatory Visit: Payer: Self-pay | Admitting: Urology

## 2014-10-16 NOTE — Progress Notes (Signed)
Called requested to release to sign and held in Epic surgery 10-21-14 pre op 10-18-14 Thanks

## 2014-10-18 ENCOUNTER — Encounter (HOSPITAL_COMMUNITY)
Admission: RE | Admit: 2014-10-18 | Discharge: 2014-10-18 | Disposition: A | Discharge status: Home / Self Care | Payer: Federal, State, Local not specified - PPO | Source: Ambulatory Visit | Attending: Urology | Admitting: Urology

## 2014-10-18 ENCOUNTER — Encounter (HOSPITAL_COMMUNITY): Payer: Self-pay

## 2014-10-18 DIAGNOSIS — F329 Major depressive disorder, single episode, unspecified: Secondary | ICD-10-CM | POA: Diagnosis not present

## 2014-10-18 DIAGNOSIS — F419 Anxiety disorder, unspecified: Secondary | ICD-10-CM | POA: Diagnosis not present

## 2014-10-18 DIAGNOSIS — I10 Essential (primary) hypertension: Secondary | ICD-10-CM | POA: Diagnosis not present

## 2014-10-18 DIAGNOSIS — F17210 Nicotine dependence, cigarettes, uncomplicated: Secondary | ICD-10-CM | POA: Diagnosis not present

## 2014-10-18 DIAGNOSIS — N201 Calculus of ureter: Secondary | ICD-10-CM | POA: Diagnosis not present

## 2014-10-18 HISTORY — DX: Headache, unspecified: R51.9

## 2014-10-18 HISTORY — DX: Headache: R51

## 2014-10-18 LAB — CBC
HCT: 42 % (ref 39.0–52.0)
Hemoglobin: 14.2 g/dL (ref 13.0–17.0)
MCH: 29.8 pg (ref 26.0–34.0)
MCHC: 33.8 g/dL (ref 30.0–36.0)
MCV: 88.2 fL (ref 78.0–100.0)
Platelets: 293 10*3/uL (ref 150–400)
RBC: 4.76 MIL/uL (ref 4.22–5.81)
RDW: 12.5 % (ref 11.5–15.5)
WBC: 10.3 10*3/uL (ref 4.0–10.5)

## 2014-10-18 LAB — BASIC METABOLIC PANEL
Anion gap: 7 (ref 5–15)
BUN: 16 mg/dL (ref 6–23)
CO2: 27 mmol/L (ref 19–32)
Calcium: 9.9 mg/dL (ref 8.4–10.5)
Chloride: 105 mmol/L (ref 96–112)
Creatinine, Ser: 1.19 mg/dL (ref 0.50–1.35)
GFR calc Af Amer: >90 mL/min (ref >90)
GFR, EST NON AFRICAN AMERICAN: 86 mL/min — AB (ref >90)
Glucose, Bld: 95 mg/dL (ref 70–99)
Potassium: 4.9 mmol/L (ref 3.5–5.1)
Sodium: 139 mmol/L (ref 135–145)

## 2014-10-18 NOTE — Progress Notes (Signed)
ekg 1/16  Epic

## 2014-10-18 NOTE — Patient Instructions (Addendum)
Your procedure is scheduled on: 10/21/14  MONDAY   Report to [facility]- FOLLOW SIGNS TO SHORT STAY CENTER Short Stay Center at    1015   AM.   Call this number if you have problems the morning of surgery: [phone]        Do not eat food  Or drink :After Midnight. Sunday night   Take these medicines the morning of surgery with A SIP OF WATER: tamsulosin May take oxycodone, zofran if needed   .  Contacts, dentures or partial plates, or metal hairpins  can not be worn to surgery. Your family will be responsible for glasses, dentures, hearing aides while you are in surgery  Leave suitcase in the car. After surgery it may be brought to your room.  For patients admitted to the hospital, checkout time is 11:00 AM day of  discharge.         [facility] IS NOT RESPONSIBLE FOR ANY VALUABLES  Patients discharged the day of surgery will not be allowed to drive home. IF going home the day of surgery, you must have a driver and someone to stay with you for the first 24 hours  Name and phone number of your driver:    parents                                                                                                                                     [facility] - Preparing for [facility] Before surgery, you can play an important role.  Because skin is not sterile, your skin needs to be as free of germs as possible.  You can reduce the number of germs on your skin by washing with CHG (chlorahexidine gluconate) soap before surgery.  CHG is an antiseptic cleaner which kills germs and bonds with the skin to continue killing germs even after washing. Please DO NOT use if you have an allergy to CHG or antibacterial soaps.  If your skin becomes reddened/irritated stop using the CHG and inform your nurse when you arrive at Short Stay. Do not shave (including legs and underarms) for at least 48 hours prior to the first CHG shower.  You may shave your face/neck. Please follow  these instructions carefully:  1.  Shower with CHG Soap the night before surgery and the  morning of Surgery.  2.  If you choose to wash your hair, wash your hair first as usual with your  normal  shampoo.  3.  After you shampoo, rinse your hair and body thoroughly to remove the  shampoo.                           4.  Use CHG as you would any other liquid soap.  You can apply chg directly  to the skin and wash  Gently with a scrungie or clean washcloth.  5.  Apply the CHG Soap to your body ONLY FROM THE NECK DOWN.   Do not use on face/ open                           Wound or open sores. Avoid contact with eyes, ears mouth and genitals (private parts).                       Wash face,  Genitals (private parts) with your normal soap.             6.  Wash thoroughly, paying special attention to the area where your surgery  will be performed.  7.  Thoroughly rinse your body with warm water from the neck down.  8.  DO NOT shower/wash with your normal soap after using and rinsing off  the CHG Soap.                9.  Pat yourself dry with a clean towel.            10.  Wear clean pajamas.            11.  Place clean sheets on your bed the night of your first shower and do not  sleep with pets. Day of Surgery : Do not apply any lotions/deodorants the morning of surgery.  Please wear clean clothes to the hospital/surgery center.  FAILURE TO FOLLOW THESE INSTRUCTIONS MAY RESULT IN THE CANCELLATION OF YOUR SURGERY PATIENT SIGNATURE_________________________________  NURSE SIGNATURE__________________________________  ________________________________________________________________________

## 2014-10-21 ENCOUNTER — Encounter (HOSPITAL_COMMUNITY): Payer: Self-pay | Admitting: *Deleted

## 2014-10-21 ENCOUNTER — Encounter (HOSPITAL_COMMUNITY)
Admission: RE | Disposition: A | Discharge status: Home / Self Care | Payer: Self-pay | Source: Ambulatory Visit | Attending: Urology

## 2014-10-21 ENCOUNTER — Ambulatory Visit (HOSPITAL_COMMUNITY)
Admission: RE | Admit: 2014-10-21 | Discharge: 2014-10-21 | Disposition: A | Discharge status: Home / Self Care | Payer: Federal, State, Local not specified - PPO | Source: Ambulatory Visit | Attending: Urology | Admitting: Urology

## 2014-10-21 ENCOUNTER — Ambulatory Visit (HOSPITAL_COMMUNITY): Payer: Federal, State, Local not specified - PPO | Admitting: Certified Registered"

## 2014-10-21 DIAGNOSIS — I10 Essential (primary) hypertension: Secondary | ICD-10-CM | POA: Insufficient documentation

## 2014-10-21 DIAGNOSIS — F419 Anxiety disorder, unspecified: Secondary | ICD-10-CM | POA: Insufficient documentation

## 2014-10-21 DIAGNOSIS — N201 Calculus of ureter: Secondary | ICD-10-CM | POA: Insufficient documentation

## 2014-10-21 DIAGNOSIS — F17210 Nicotine dependence, cigarettes, uncomplicated: Secondary | ICD-10-CM | POA: Insufficient documentation

## 2014-10-21 DIAGNOSIS — F329 Major depressive disorder, single episode, unspecified: Secondary | ICD-10-CM | POA: Insufficient documentation

## 2014-10-21 HISTORY — PX: CYSTOSCOPY WITH RETROGRADE PYELOGRAM, URETEROSCOPY AND STENT PLACEMENT: SHX5789

## 2014-10-21 HISTORY — PX: HOLMIUM LASER APPLICATION: SHX5852

## 2014-10-21 SURGERY — CYSTOURETEROSCOPY, WITH RETROGRADE PYELOGRAM AND STENT INSERTION
Anesthesia: General | Laterality: Right

## 2014-10-21 MED ORDER — FENTANYL CITRATE 0.05 MG/ML IJ SOLN
INTRAMUSCULAR | Status: AC
  Filled 2014-10-21: qty 2

## 2014-10-21 MED ORDER — LACTATED RINGERS IV SOLN
INTRAVENOUS | Status: DC
  Administered 2014-10-21: 1000 mL via INTRAVENOUS

## 2014-10-21 MED ORDER — LIDOCAINE HCL 2 % EX GEL
CUTANEOUS | Status: AC
  Filled 2014-10-21: qty 10

## 2014-10-21 MED ORDER — SODIUM CHLORIDE 0.9 % IR SOLN
Status: DC | PRN
  Administered 2014-10-21: 3000 mL

## 2014-10-21 MED ORDER — FENTANYL CITRATE 0.05 MG/ML IJ SOLN
INTRAMUSCULAR | Status: DC | PRN
  Administered 2014-10-21 (×3): 50 ug via INTRAVENOUS

## 2014-10-21 MED ORDER — TRIMETHOPRIM 100 MG PO TABS: 100.0000 mg | ORAL_TABLET | ORAL | Status: DC

## 2014-10-21 MED ORDER — MIDAZOLAM HCL 2 MG/2ML IJ SOLN
INTRAMUSCULAR | Status: AC
  Filled 2014-10-21: qty 2

## 2014-10-21 MED ORDER — ONDANSETRON HCL 4 MG/2ML IJ SOLN
INTRAMUSCULAR | Status: DC | PRN
  Administered 2014-10-21: 4 mg via INTRAVENOUS

## 2014-10-21 MED ORDER — 0.9 % SODIUM CHLORIDE (POUR BTL) OPTIME
TOPICAL | Status: DC | PRN
  Administered 2014-10-21: 1000 mL

## 2014-10-21 MED ORDER — ACETAMINOPHEN 10 MG/ML IV SOLN
INTRAVENOUS | Status: DC | PRN
  Administered 2014-10-21: 1000 mg via INTRAVENOUS

## 2014-10-21 MED ORDER — TRAMADOL-ACETAMINOPHEN 37.5-325 MG PO TABS: 1.0000 | ORAL_TABLET | Freq: Four times a day (QID) | ORAL | Status: DC | PRN

## 2014-10-21 MED ORDER — TAMSULOSIN HCL 0.4 MG PO CAPS: 0.4000 mg | ORAL_CAPSULE | Freq: Every day | ORAL | Status: DC

## 2014-10-21 MED ORDER — PROPOFOL 10 MG/ML IV BOLUS
INTRAVENOUS | Status: DC | PRN
  Administered 2014-10-21: 200 mg via INTRAVENOUS

## 2014-10-21 MED ORDER — KETOROLAC TROMETHAMINE 30 MG/ML IJ SOLN
INTRAMUSCULAR | Status: AC
  Filled 2014-10-21: qty 1

## 2014-10-21 MED ORDER — IOHEXOL 300 MG/ML  SOLN
INTRAMUSCULAR | Status: DC | PRN
  Administered 2014-10-21: 2 mL

## 2014-10-21 MED ORDER — BELLADONNA ALKALOIDS-OPIUM 16.2-60 MG RE SUPP
RECTAL | Status: AC
  Filled 2014-10-21: qty 1

## 2014-10-21 MED ORDER — KETOROLAC TROMETHAMINE 30 MG/ML IJ SOLN
INTRAMUSCULAR | Status: DC | PRN
  Administered 2014-10-21: 30 mg via INTRAVENOUS

## 2014-10-21 MED ORDER — PHENAZOPYRIDINE HCL 200 MG PO TABS: 200.0000 mg | ORAL_TABLET | Freq: Three times a day (TID) | ORAL | Status: DC | PRN

## 2014-10-21 MED ORDER — LIDOCAINE HCL (CARDIAC) 20 MG/ML IV SOLN
INTRAVENOUS | Status: DC | PRN
  Administered 2014-10-21: 20 mg via INTRAVENOUS

## 2014-10-21 MED ORDER — CEFAZOLIN SODIUM-DEXTROSE 2-3 GM-% IV SOLR
2.0000 g | INTRAVENOUS | Status: AC
  Administered 2014-10-21: 2 g via INTRAVENOUS

## 2014-10-21 MED ORDER — ONDANSETRON HCL 4 MG/2ML IJ SOLN
INTRAMUSCULAR | Status: AC
  Filled 2014-10-21: qty 2

## 2014-10-21 MED ORDER — SODIUM CHLORIDE 0.9 % IR SOLN
Status: DC | PRN
Start: 2014-10-21 — End: 2014-10-21
  Administered 2014-10-21: 1000 mL

## 2014-10-21 MED ORDER — HYDROMORPHONE HCL 1 MG/ML IJ SOLN
0.5000 mg | INTRAMUSCULAR | Status: DC | PRN
  Administered 2014-10-21: 0.25 mg via INTRAVENOUS

## 2014-10-21 MED ORDER — MIDAZOLAM HCL 5 MG/5ML IJ SOLN
INTRAMUSCULAR | Status: DC | PRN
  Administered 2014-10-21: 2 mg via INTRAVENOUS

## 2014-10-21 MED ORDER — DEXAMETHASONE SODIUM PHOSPHATE 10 MG/ML IJ SOLN
INTRAMUSCULAR | Status: DC | PRN
  Administered 2014-10-21: 10 mg via INTRAVENOUS

## 2014-10-21 MED ORDER — PHENAZOPYRIDINE HCL 200 MG PO TABS
200.0000 mg | ORAL_TABLET | Freq: Once | ORAL | Status: DC
  Filled 2014-10-21: qty 1

## 2014-10-21 MED ORDER — FENTANYL CITRATE 0.05 MG/ML IJ SOLN
25.0000 ug | INTRAMUSCULAR | Status: DC | PRN
  Administered 2014-10-21 (×2): 25 ug via INTRAVENOUS
  Administered 2014-10-21: 50 ug via INTRAVENOUS

## 2014-10-21 MED ORDER — PROPOFOL 10 MG/ML IV BOLUS
INTRAVENOUS | Status: AC
  Filled 2014-10-21: qty 20

## 2014-10-21 MED ORDER — HYDROMORPHONE HCL 1 MG/ML IJ SOLN
INTRAMUSCULAR | Status: AC
  Filled 2014-10-21: qty 1

## 2014-10-21 MED ORDER — DEXAMETHASONE SODIUM PHOSPHATE 10 MG/ML IJ SOLN
INTRAMUSCULAR | Status: AC
  Filled 2014-10-21: qty 1

## 2014-10-21 MED ORDER — ACETAMINOPHEN 10 MG/ML IV SOLN
1000.0000 mg | Freq: Once | INTRAVENOUS | Status: DC
  Filled 2014-10-21: qty 100

## 2014-10-21 MED ORDER — BELLADONNA ALKALOIDS-OPIUM 16.2-60 MG RE SUPP
RECTAL | Status: DC | PRN
  Administered 2014-10-21: 1 via RECTAL

## 2014-10-21 MED ORDER — LIDOCAINE HCL (CARDIAC) 20 MG/ML IV SOLN
INTRAVENOUS | Status: AC
  Filled 2014-10-21: qty 5

## 2014-10-21 SURGICAL SUPPLY — 20 items
BAG URO CATCHER STRL LF (DRAPE) ×2 IMPLANT
BASKET ZERO TIP NITINOL 2.4FR (BASKET) ×2 IMPLANT
CATH INTERMIT  6FR 70CM (CATHETERS) ×2 IMPLANT
CLOTH BEACON ORANGE TIMEOUT ST (SAFETY) ×2 IMPLANT
FIBER LASER FLEXIVA 1000 (UROLOGICAL SUPPLIES) IMPLANT
FIBER LASER FLEXIVA 200 (UROLOGICAL SUPPLIES) ×2 IMPLANT
FIBER LASER FLEXIVA 365 (UROLOGICAL SUPPLIES) IMPLANT
FIBER LASER FLEXIVA 550 (UROLOGICAL SUPPLIES) IMPLANT
FIBER LASER TRAC TIP (UROLOGICAL SUPPLIES) ×2 IMPLANT
GLOVE BIOGEL M STRL SZ7.5 (GLOVE) ×2 IMPLANT
GOWN STRL REUS W/TWL XL LVL3 (GOWN DISPOSABLE) ×2 IMPLANT
GUIDEWIRE STR DUAL SENSOR (WIRE) ×4 IMPLANT
MANIFOLD NEPTUNE II (INSTRUMENTS) ×2 IMPLANT
NS IRRIG 1000ML POUR BTL (IV SOLUTION) IMPLANT
PACK CYSTO (CUSTOM PROCEDURE TRAY) ×2 IMPLANT
SCRUB PCMX 4 OZ (MISCELLANEOUS) ×2 IMPLANT
SHEATH ACCESS URETERAL 38CM (SHEATH) ×2 IMPLANT
SHIELD EYE BINOCULAR (MISCELLANEOUS) ×2 IMPLANT
STENT CONTOUR 6FRX24X.038 (STENTS) ×2 IMPLANT
TUBING CONNECTING 10 (TUBING) ×2 IMPLANT

## 2014-10-21 NOTE — Transfer of Care (Signed)
Immediate Anesthesia Transfer of Care Note  Patient: Tony Hayes  Procedure(s) Performed: Procedure(s) (LRB): CYSTOSCOPY WITH RIGHT RETROGRADE PYELOGRAM, RIGHT URETEROSCOPY AND  STONE EXTRACTION ,STENT PLACEMENT (Right) HOLMIUM LASER  (Right)  Patient Location: PACU  Anesthesia Type: General  Level of Consciousness: sedated, patient cooperative and responds to stimulation  Airway & Oxygen Therapy: Patient Spontanous Breathing and Patient connected to face mask oxgen  Post-op Assessment: Report given to PACU RN and Post -op Vital signs reviewed and stable  Post vital signs: Reviewed and stable  Complications: No apparent anesthesia complications

## 2014-10-21 NOTE — Interval H&P Note (Signed)
History and Physical Interval Note:  10/21/2014 12:05 PM  Tony BossJonathan K Delorey  has presented today for surgery, with the diagnosis of RIGHT UPJ STONE   The various methods of treatment have been discussed with the patient and family. After consideration of risks, benefits and other options for treatment, the patient has consented to  Procedure(s): CYSTOSCOPY WITH RIGHT RETROGRADE PYELOGRAM, RIGHT URETEROSCOPY AND POSSBILE DOUBLE STENT PLACEMENT (Right) HOLMIUM LASER  (Right) as a surgical intervention .  The patient's history has been reviewed, patient examined, no change in status, stable for surgery.  I have reviewed the patient's chart and labs.  Questions were answered to the patient's satisfaction.     Jethro BolusANNENBAUM, Khyree Carillo I

## 2014-10-21 NOTE — Anesthesia Preprocedure Evaluation (Signed)
Anesthesia Evaluation  Patient identified by MRN, date of birth, ID band Patient awake    Reviewed: Allergy & Precautions, H&P , NPO status , Patient's Chart, lab work & pertinent test results  Airway Mallampati: II  TM Distance: >3 FB Neck ROM: Full    Dental no notable dental hx.    Pulmonary neg pulmonary ROS, Current Smoker,  breath sounds clear to auscultation  Pulmonary exam normal       Cardiovascular negative cardio ROS  Rhythm:Regular Rate:Normal     Neuro/Psych negative neurological ROS  negative psych ROS   GI/Hepatic negative GI ROS, Neg liver ROS,   Endo/Other  negative endocrine ROS  Renal/GU negative Renal ROS  negative genitourinary   Musculoskeletal negative musculoskeletal ROS (+)   Abdominal   Peds negative pediatric ROS (+)  Hematology negative hematology ROS (+)   Anesthesia Other Findings   Reproductive/Obstetrics negative OB ROS                             Anesthesia Physical  Anesthesia Plan  ASA: I  Anesthesia Plan: General   Post-op Pain Management:    Induction: Intravenous  Airway Management Planned: Oral ETT  Additional Equipment:   Intra-op Plan:   Post-operative Plan: Extubation in OR  Informed Consent: I have reviewed the patients History and Physical, chart, labs and discussed the procedure including the risks, benefits and alternatives for the proposed anesthesia with the patient or authorized representative who has indicated his/her understanding and acceptance.   Dental advisory given  Plan Discussed with: CRNA  Anesthesia Plan Comments:         Anesthesia Quick Evaluation

## 2014-10-21 NOTE — Discharge Instructions (Signed)
Kidney Stones °Kidney stones (urolithiasis) are solid masses that form inside your kidneys. The intense pain is caused by the stone moving through the kidney, ureter, bladder, and urethra (urinary tract). When the stone moves, the ureter starts to spasm around the stone. The stone is usually passed in your pee (urine).  °HOME CARE °· Drink enough fluids to keep your pee clear or pale yellow. This helps to get the stone out. °· Strain all pee through the provided strainer. Do not pee without peeing through the strainer, not even once. If you pee the stone out, catch it in the strainer. The stone may be as small as a grain of salt. Take this to your doctor. This will help your doctor figure out what you can do to try to prevent more kidney stones. °· Only take medicine as told by your doctor. °· Follow up with your doctor as told. °· Get follow-up X-rays as told by your doctor. °GET HELP IF: °You have pain that gets worse even if you have been taking pain medicine. °GET HELP RIGHT AWAY IF:  °· Your pain does not get better with medicine. °· You have a fever or shaking chills. °· Your pain increases and gets worse over 18 hours. °· You have new belly (abdominal) pain. °· You feel faint or pass out. °· You are unable to pee. °MAKE SURE YOU:  °· Understand these instructions. °· Will watch your condition. °· Will get help right away if you are not doing well or get worse. °Document Released: 02/16/2008 Document Revised: 05/02/2013 Document Reviewed: 01/31/2013 °ExitCare® Patient Information ©2015 [employer]. This information is not intended to replace advice given to you by your health care provider. Make sure you discuss any questions you have with your health care provider. ° °Dietary Guidelines to Help Prevent Kidney Stones °Your risk of kidney stones can be decreased by adjusting the foods you eat. The most important thing you can do is drink enough fluid. You should drink enough fluid to keep your urine clear or  pale yellow. The following guidelines provide specific information for the type of kidney stone you have had. °GUIDELINES ACCORDING TO TYPE OF KIDNEY STONE °Calcium Oxalate Kidney Stones °· Reduce the amount of salt you eat. Foods that have a lot of salt cause your body to release excess calcium into your urine. The excess calcium can combine with a substance called oxalate to form kidney stones. °· Reduce the amount of animal protein you eat if the amount you eat is excessive. Animal protein causes your body to release excess calcium into your urine. Ask your dietitian how much protein from animal sources you should be eating. °· Avoid foods that are high in oxalates. If you take vitamins, they should have less than 500 mg of vitamin C. Your body turns vitamin C into oxalates. You do not need to avoid fruits and vegetables high in vitamin C. °Calcium Phosphate Kidney Stones °· Reduce the amount of salt you eat to help prevent the release of excess calcium into your urine. °· Reduce the amount of animal protein you eat if the amount you eat is excessive. Animal protein causes your body to release excess calcium into your urine. Ask your dietitian how much protein from animal sources you should be eating. °· Get enough calcium from food or take a calcium supplement (ask your dietitian for recommendations). Food sources of calcium that do not increase your risk of kidney stones include: °¨ Broccoli. °¨ Dairy products,   such as cheese and yogurt. °¨ Pudding. °Uric Acid Kidney Stones °· Do not have more than 6 oz of animal protein per day. °FOOD SOURCES °Animal Protein Sources °· Meat (all types). °· Poultry. °· Eggs. °· Fish, seafood. °Foods High in Salt °· Salt seasonings. °· Soy sauce. °· Teriyaki sauce. °· Cured and processed meats. °· Salted crackers and snack foods. °· Fast food. °· Canned soups and most canned foods. °Foods High in Oxalates °· Grains: °¨ Amaranth. °¨ Barley. °¨ Grits. °¨ Wheat  germ. °¨ Bran. °¨ Buckwheat flour. °¨ All bran cereals. °¨ Pretzels. °¨ Whole wheat bread. °· Vegetables: °¨ Beans (wax). °¨ Beets and beet greens. °¨ Collard greens. °¨ Eggplant. °¨ Escarole. °¨ Leeks. °¨ Okra. °¨ Parsley. °¨ Rutabagas. °¨ Spinach. °¨ Swiss chard. °¨ Tomato paste. °¨ Fried potatoes. °¨ Sweet potatoes. °· Fruits: °¨ Red currants. °¨ Figs. °¨ Kiwi. °¨ Rhubarb. °· Meat and Other Protein Sources: °¨ Beans (dried). °¨ Soy burgers and other soybean products. °¨ Miso. °¨ Nuts (peanuts, almonds, pecans, cashews, hazelnuts). °¨ Nut butters. °¨ Sesame seeds and tahini (paste made of sesame seeds). °¨ Poppy seeds. °· Beverages: °¨ Chocolate drink mixes. °¨ Soy milk. °¨ Instant iced tea. °¨ Juices made from high-oxalate fruits or vegetables. °· Other: °¨ Carob. °¨ Chocolate. °¨ Fruitcake. °¨ Marmalades. °Document Released: 12/25/2010 Document Revised: 09/04/2013 Document Reviewed: 07/27/2013 °ExitCare® Patient Information ©2015 [employer]. This information is not intended to replace advice given to you by your health care provider. Make sure you discuss any questions you have with your health care provider. ° °

## 2014-10-21 NOTE — Op Note (Signed)
Pre-operative diagnosis :  Impacted Right ureteropelvic junction stone  Postoperative diagnosis:  Same  Operation:  Cystourethroscopy, right retrograde pyelogram interpretation, placement of right upper ureteral dilation sheath, digital flexor ureteroscopy, laser fractionation of right UPJ stone, basket extraction of stone fragments, right double-J stent (6 [place] by 24 cm).  Surgeon:  Kathie RhodesS. Patsi Searsannenbaum, MD  First assistant:  None Anesthesia:  General LMA  Preparation:  After appropriate preanesthesia, the patient was brought the operating room, placed on the operating table in the dorsal supine position where general LMA anesthesia was introduced. He was replaced in the dorsal lithotomy position with the pubis was prepped with Betadine solution and draped in usual fashion. The arm and was double checked. History was double checked. The arm was previously marked.  Review history:  History of Present Illness [age] yo male presents today for a KUB post ESWL with continued stone in the Right UPJ. . He was seen in the ER on 09/05/14 for a 1 week hx of gross hematuria & intermittent Rt flank pain. Patient was originally evaluated by PCP & CT showed a 1cm Rt upper ureteral stone with obstruction. He had a cystoscopy/Rt RPG/Rt JJ stent placed on 09/05/14.   Past Medical History Problems  1. History of Anxiety (F41.9) 2. History of depression (Z86.59) 3. History of hypertension (Z86.79)  Statement of  Likelihood of Success: Excellent. TIME-OUT observed.:  Procedure:  Cystourethroscopy was accomplished. Right double-J catheter was identified, and removed using the biopsy forceps. Right retrograde pyelogram revealed right UPJ junction stone, which was impacted. 2 separate 0.038 guidewires were then passed around the stone and coiled in the renal pelvis. One guidewire was saved as a safety wire, and the other was used as a working wire. Over the working wire, a mid urethral dilating sheath was passed. The  wire and the inner sheath were then removed. The digital flexible ureteroscope was then passed through the sheath, and up to the level of the ureteropelvic junction. Severe edema was noted at the ureteropelvic junction, and following the guidewire, the true UPJ was identified, and stone wasn't notified. It was photo documented. Using the holmium laser, with a laser fiber of 200 , and settings of 0.5/10, the stone was fragmented. Fragments were then removed and irrigated free from the UPJ, into the bladder. No further stones identified. The renal pelvis was identified, and scope, and no other stones were identified. Although it is possible it stones flushed into the calyces, none were identified on retrograde pyelogram. Because of edema at the UPJ, I elected to pass a double-J stent. Therefore, the safety wire was left in place, and the mid ureteral dilating sheath removed along with the digital scope. The wire was then backloaded over the cystoscope, and the 6 [place] by 24 cm double-J stent was passed into the renal pelvis, and coiled in the bladder. The patient received IV Tylenol, IV Toradol during the procedure. No Foley catheter was placed. The bladder was drained of fluid, and the patient was awakened and taken to recovery room in good condition.

## 2014-10-21 NOTE — Anesthesia Postprocedure Evaluation (Signed)
Anesthesia Post Note  Patient: Tony BossJonathan K Hayes  Procedure(s) Performed: Procedure(s) (LRB): CYSTOSCOPY WITH RIGHT RETROGRADE PYELOGRAM, RIGHT URETEROSCOPY AND  STONE EXTRACTION ,STENT PLACEMENT (Right) HOLMIUM LASER  (Right)  Anesthesia type: general  Patient location: PACU  Post pain: Pain level controlled  Post assessment: Patient's Cardiovascular Status Stable  Last Vitals:  Filed Vitals:   10/21/14 1454  BP: 120/67  Pulse: 82  Temp: 36.3 C  Resp: 12    Post vital signs: Reviewed and stable  Level of consciousness: sedated  Complications: No apparent anesthesia complications

## 2014-10-21 NOTE — OR Nursing (Signed)
Stone taken by Dr. Patsi Searsannenbaum

## 2014-10-21 NOTE — H&P (Signed)
Reason For Visit KUB & hospital f/u   Active Problems Problems  1. Right ureteral stone (N20.1)  History of Present Illness 24 yo male presents today for a KUB post ESWL with continued stone in the Right UPJ. . He was seen in the ER on 09/05/14 for a 1 week hx of gross hematuria & intermittent Rt flank pain. Patient was originally evaluated by PCP & CT showed a 1cm Rt upper ureteral stone with obstruction. He had a cystoscopy/Rt RPG/Rt JJ stent placed on 09/05/14.   Past Medical History Problems  1. History of Anxiety (F41.9) 2. History of depression (Z86.59) 3. History of hypertension (Z86.79)  Surgical History Problems  1. History of Elective Circumcision 2. History of Surgery Testis 3. History of Surgery Testis Exploration Of Undescended Testis  Current Meds 1. Effexor TABS;  Therapy: (Recorded:30Dec2015) to Recorded 2. LaMICtal TABS;  Therapy: (Recorded:30Dec2015) to Recorded  Allergies Medication  1. No Known Drug Allergies  Social History Problems  1. Caffeine Use  Review of Systems Genitourinary, constitutional, skin, eye, otolaryngeal, hematologic/lymphatic, cardiovascular, pulmonary, endocrine, musculoskeletal, gastrointestinal, neurological and psychiatric system(s) were reviewed and pertinent findings if present are noted and are otherwise negative.  Genitourinary: urinary frequency, difficulty starting the urinary stream, weak urinary stream, hematuria and initiating urination requires straining.  Gastrointestinal: nausea.  Constitutional: feeling tired (fatigue).  ENT: sinus problems.  Endocrine: polydipsia.  Neurological: headache and dizziness.  Psychiatric: anxiety and depression.    Vitals Vital Signs [Data Includes: Last 1 Day]  Recorded: 30Dec2015 11:19AM  Height: 5 ft 10 in Weight: 204 lb  BMI Calculated: 29.27 BSA Calculated: 2.1 Blood Pressure: 122 / 74 Heart Rate: 84  Physical Exam Constitutional: Well nourished and well developed . No  acute distress.  ENT:. The ears and nose are normal in appearance.  Neck: The appearance of the neck is normal and no neck mass is present.  Pulmonary: No respiratory distress and normal respiratory rhythm and effort.  Cardiovascular: Heart rate and rhythm are normal . No peripheral edema.  Abdomen: The abdomen is soft and nontender. No masses are palpated. No CVA tenderness. No hernias are palpable. No hepatosplenomegaly noted.  Genitourinary: Examination of the penis demonstrates no discharge, no masses, no lesions and a normal meatus. The scrotum is without lesions. The right epididymis is palpably normal and non-tender. The left epididymis is palpably normal and non-tender. The right testis is non-tender and without masses. The left testis is non-tender and without masses.  Lymphatics: The femoral and inguinal nodes are not enlarged or tender.  Skin: Normal skin turgor, no visible rash and no visible skin lesions.  Neuro/Psych:. Mood and affect are appropriate.    Results/Data Urine [Data Includes: Last 1 Day]   30Dec2015  COLOR AMBER   APPEARANCE CLOUDY   SPECIFIC GRAVITY 1.025   pH 6.5   GLUCOSE NEG mg/dL  BILIRUBIN SMALL   KETONE NEG mg/dL  BLOOD LARGE   PROTEIN 100 mg/dL  UROBILINOGEN 1 mg/dL  NITRITE NEG   LEUKOCYTE ESTERASE MOD   SQUAMOUS EPITHELIAL/HPF NONE SEEN   WBC NONE SEEN WBC/hpf  RBC TNTC RBC/hpf  BACTERIA RARE   CRYSTALS NONE SEEN   CASTS NONE SEEN    KUB: Right UPJ stone. Seen on KUB. Discussed lithotripsy with pt and his father.   Assessment Assessed  1. Right ureteral stone (N20.1)  Right UPJ stone post Right JJ stent. Post litho but with continued stone in the Right UPJ.   Plan Health Maintenance  1. UA With  REFLEX; [Do Not Release]; Status:Resulted - Requires Verification;   Done:  30Dec2015 10:45AM Right ureteral stone  2. Follow-up Schedule Surgery Office  Follow-up  Status: Hold For - Appointment   Requested for: 30Dec2015  Post lithotripsy  with continued stone in the R upper ureter.   Signatures

## 2014-10-22 ENCOUNTER — Encounter (HOSPITAL_COMMUNITY): Payer: Self-pay | Admitting: Urology

## 2014-10-22 NOTE — Anesthesia Postprocedure Evaluation (Signed)
  Anesthesia Post-op Note  Patient: Tony Hayes  Procedure(s) Performed: Procedure(s) (LRB): CYSTOSCOPY WITH RIGHT RETROGRADE PYELOGRAM, RIGHT URETEROSCOPY AND  STONE EXTRACTION ,STENT PLACEMENT (Right) HOLMIUM LASER  (Right)  Patient Location: PACU  Anesthesia Type: General  Level of Consciousness: awake and alert   Airway and Oxygen Therapy: Patient Spontanous Breathing  Post-op Pain: mild  Post-op Assessment: Post-op Vital signs reviewed, Patient's Cardiovascular Status Stable, Respiratory Function Stable, Patent Airway and No signs of Nausea or vomiting  Last Vitals:  Filed Vitals:   10/21/14 1600  BP: 126/60  Pulse: 74  Temp:   Resp: 16    Post-op Vital Signs: stable   Complications: No apparent anesthesia complications

## 2016-04-06 IMAGING — CT CT ABD-PELV W/O CM
3 of 4 series · 9 of 46 positions shown, 16 images · non-contrast
Comparison: None.

CLINICAL DATA: Right-sided flank pain for 5 days. History of
microscopic hematuria.

EXAM:
CT ABDOMEN AND PELVIS WITHOUT CONTRAST
TECHNIQUE: Multidetector CT imaging of the abdomen and pelvis was performed
following the standard protocol without IV contrast.

[Series 3: lung windows · axial · 0.70mm/px · z∈[-45,+35]mm · 5 of 24 slices shown, 10 images]
[im 4/24  soft-tissue]
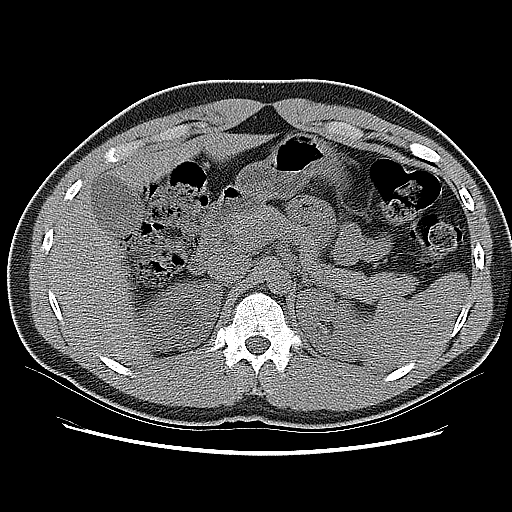
[im 4/24  bone]
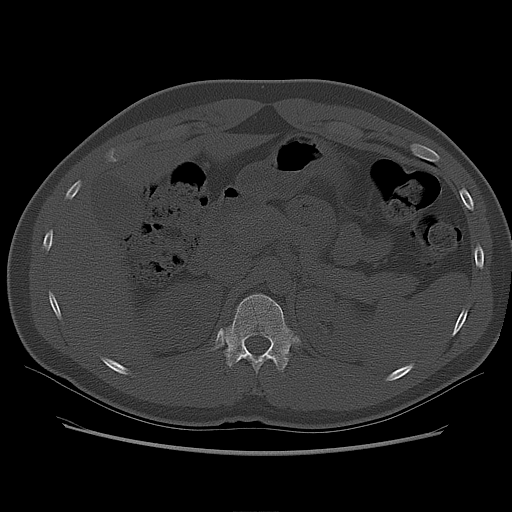
[im 8/24  soft-tissue]
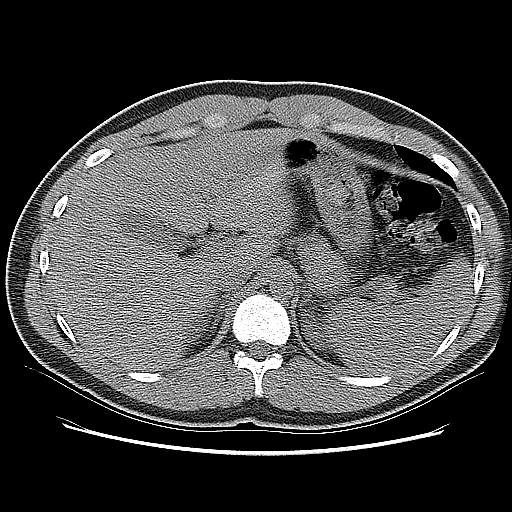
[im 8/24  lung]
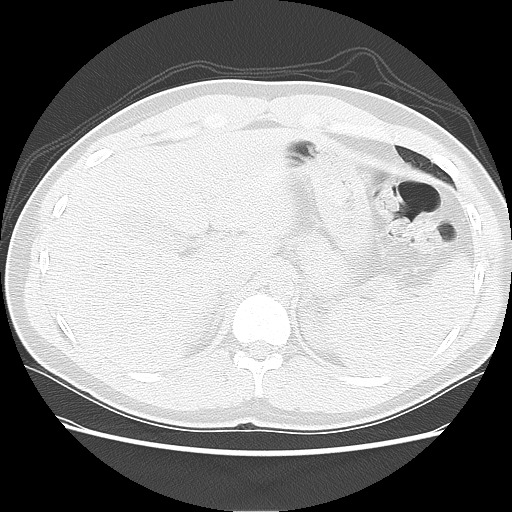
[im 12/24  soft-tissue]
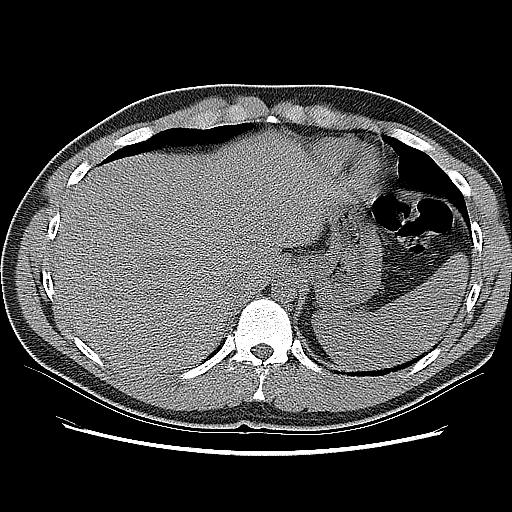
[im 12/24  lung]
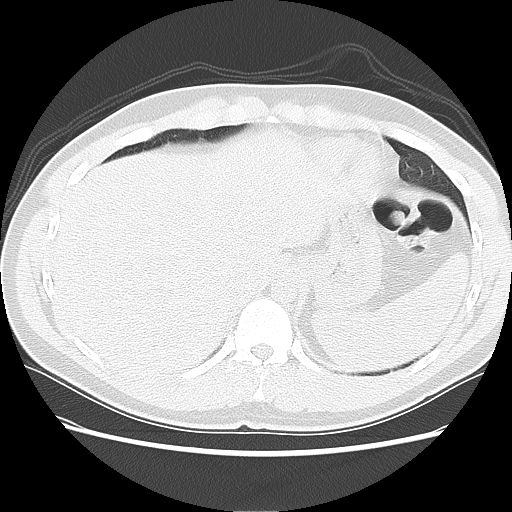
[im 16/24  soft-tissue]
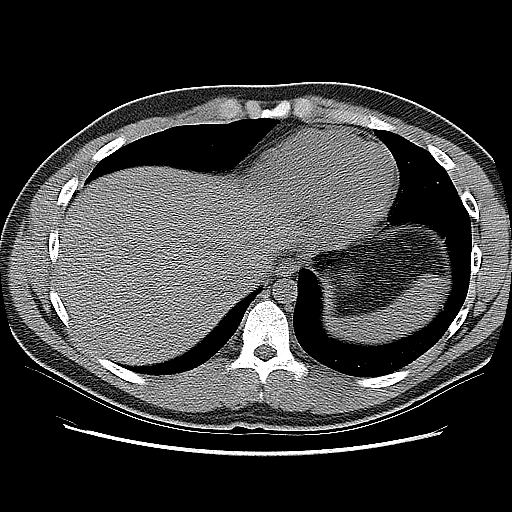
[im 16/24  lung]
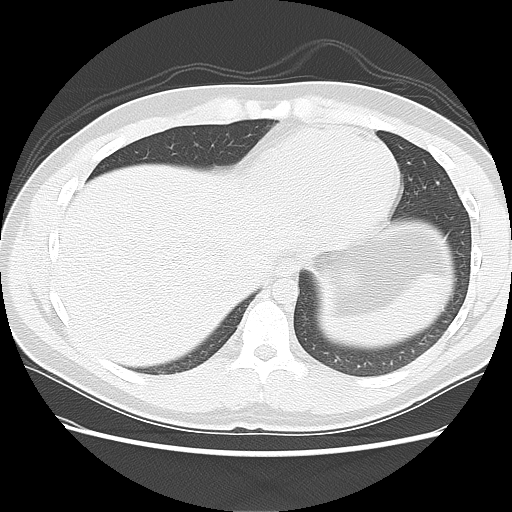
[im 20/24  soft-tissue]
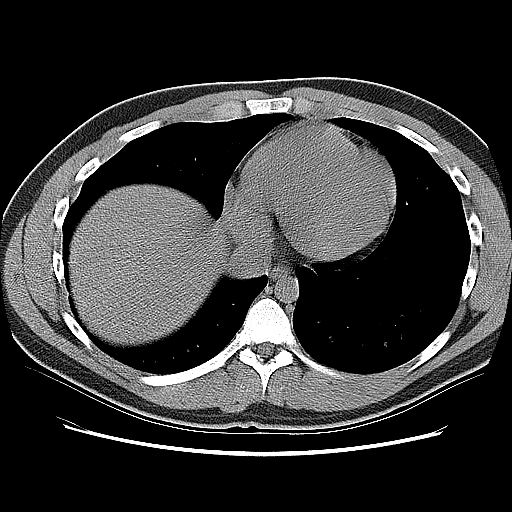
[im 20/24  lung]
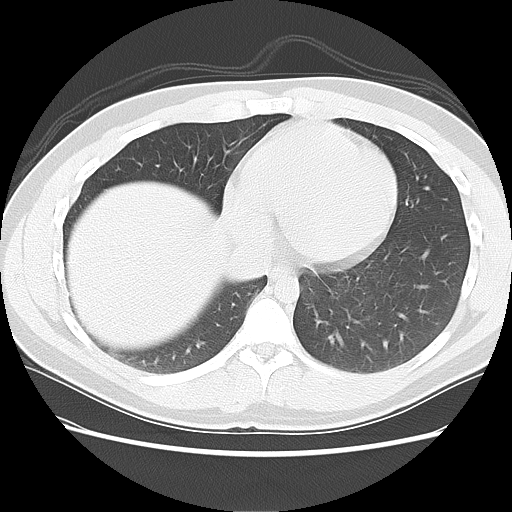

[Series 400: cor · coronal · 0.93mm/px · 3 of 134 slices shown, 4 images]
[im 45/134  soft-tissue]
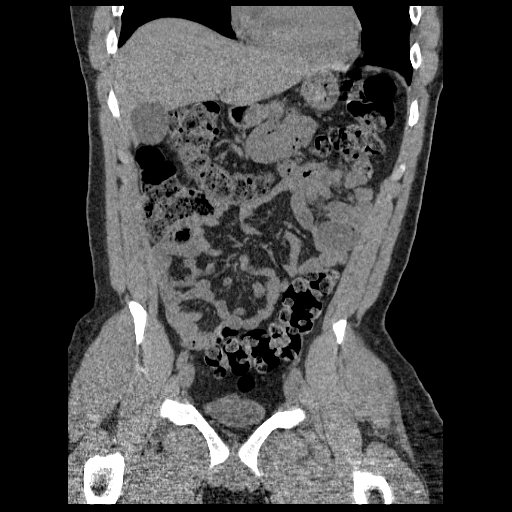
[im 60/134  soft-tissue]
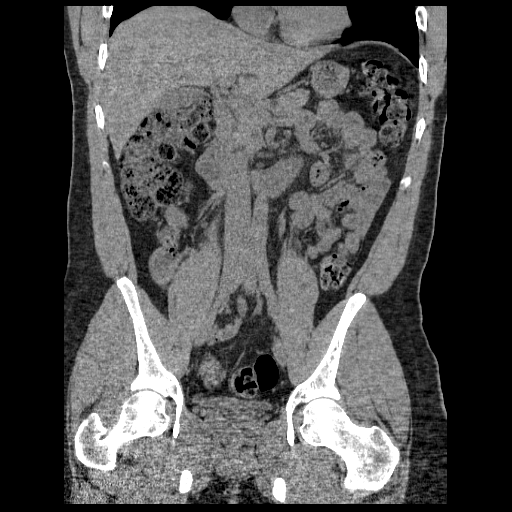
[im 60/134  bone]
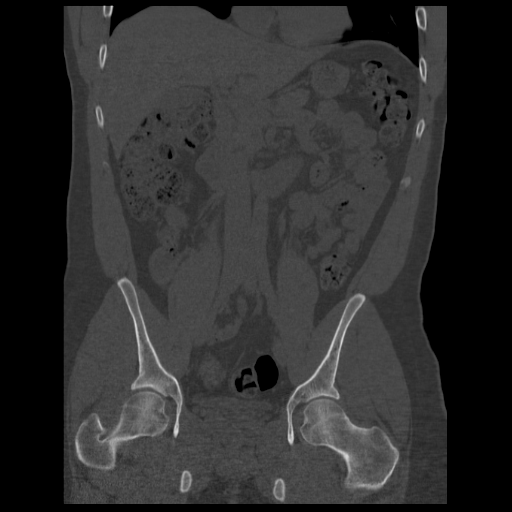
[im 74/134  soft-tissue]
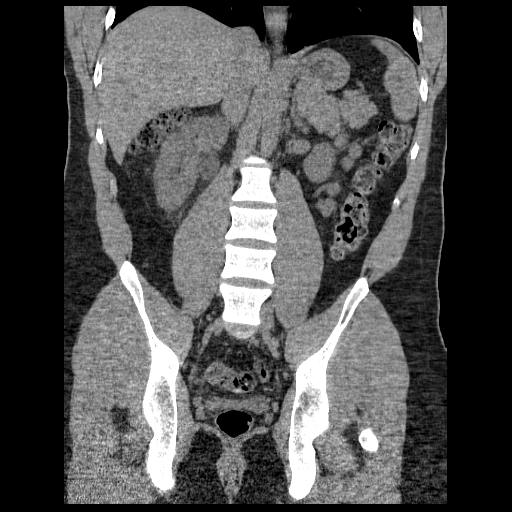

[Series 401: sag · sagittal · 0.93mm/px · 1 of 181 slices shown, 2 images]
[im 61/181  soft-tissue]
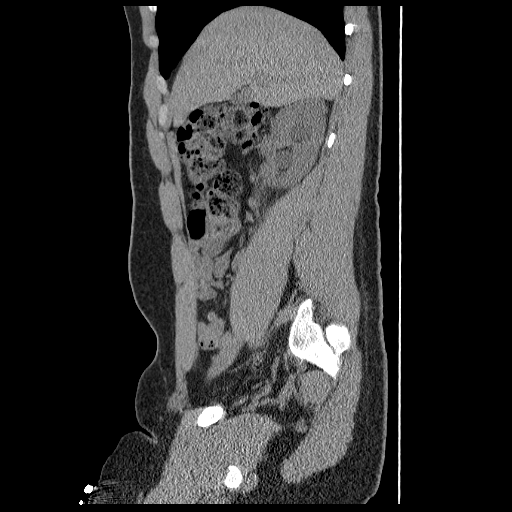
[im 61/181  bone]
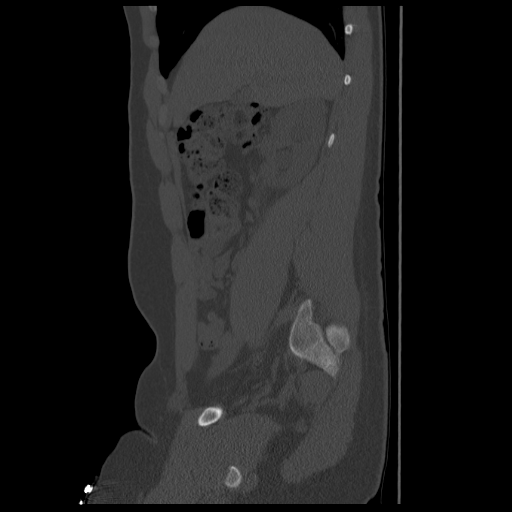

[9 of 46 positions shown; findings below may reference images not displayed]

FINDINGS: The lack of intravenous contrast limits the ability to evaluate
solid abdominal organs.

There is a punctate (approximately 1.0 x 0.7 cm) stone within the
superior aspect of the right ureter (coronal image 68, series 400)
which results in moderate upstream ureterectasis and
pelvicaliectasis. No additional renal stones identified. No evidence
of left-sided urinary obstruction. There is a minimal amount of
asymmetric left-sided periureteral stranding. Normal noncontrast
appearance of the urinary bladder given degree distention.

Normal hepatic contour. Normal noncontrast appearance of the
gallbladder. No radiopaque gallstones. No ascites.

Normal noncontrast appearance of the bilateral adrenal glands,
pancreas and spleen.

Moderate to large colonic stool burden without evidence of enteric
obstruction. Normal noncontrast appearance of the appendix. No
pneumoperitoneum, pneumatosis or portal venous gas.

Normal caliber of the abdominal aorta.

No bulky retroperitoneal, mesenteric, pelvic or inguinal
lymphadenopathy on this noncontrast examination.

Normal noncontrast appearance of the pelvic organs. No free fluid in
the pelvic cul-de-sac.

Limited visualization of the lower thorax demonstrates minimal
subsegmental atelectasis within the imaged right lower lobe. No
focal airspace opacities. No pleural effusion.

Normal heart size.  No pericardial effusion.

No acute or aggressive osseous abnormalities. Regional soft tissues
appear normal.
IMPRESSION: 1. An approximately 1 cm stone within the superior aspect of the
right ureter results an moderate upstream right-sided ureterectasis
and pelvicaliectasis.
2. No additional renal stones identified. No evidence of left-sided
urinary obstruction.
These results will be called to the ordering clinician or
representative by the Radiologist Assistant, and communication
documented in the PACS or zVision Dashboard.

## 2018-01-12 ENCOUNTER — Other Ambulatory Visit: Payer: Self-pay | Admitting: Cardiology

## 2018-01-12 ENCOUNTER — Ambulatory Visit
Admission: RE | Admit: 2018-01-12 | Discharge: 2018-01-12 | Disposition: A | Discharge status: Home / Self Care | Payer: Federal, State, Local not specified - PPO | Source: Ambulatory Visit | Attending: Cardiology | Admitting: Cardiology

## 2018-01-12 DIAGNOSIS — R079 Chest pain, unspecified: Secondary | ICD-10-CM

## 2018-06-20 ENCOUNTER — Ambulatory Visit (INDEPENDENT_AMBULATORY_CARE_PROVIDER_SITE_OTHER): Payer: Federal, State, Local not specified - PPO | Admitting: Mental Health

## 2018-06-20 DIAGNOSIS — F411 Generalized anxiety disorder: Secondary | ICD-10-CM

## 2018-06-20 NOTE — Progress Notes (Signed)
      [facility] Counselor/Therapist Progress Note   Patient ID: Tony Hayes, MRN: [id number]  Date: 06/20/2018  Timespent: 60 minutes  Treatment Type: Individual  Subjective: Present on time for today's session in mild distress.  He shared how he recently decided to quit his job about 2 weeks ago.  He stated that he was tired of coping with his supervisor and the pressure at work.  Patient had stated previously that he had had some problems with a supervisor.  Patient said he is now having some stress as he has looked for jobs but has not found one as of yet.  He plans to continue to apply for jobs.  He continues to date his girlfriend who is his fiance.  He went on to share some stressors related to the relationship, namely his concerns about her having some mental health issues.  He stated that he wants her to get engaged in treatment however she is hesitant at this time.  He stated that they continue to have some differences around their faith as he stated that she does not agree with his faith.  Provided support as patient processed feelings related.  Assisted him in some problem solving as well as short-term goals.  Patient was encouraged to focus on immediate needs such as finding a job and having increased financial stability.  Interventions:CBT  Mental Status Exam:   Appearance:   Casual     Behavior:  Appropriate  Motor:  Normal  Speech/Language:   Clear and Coherent  Affect:  Congruent  Mood:  depressed  Thought process:  Coherent and Relevant  Thought content:    Logical  Perceptual disturbances:    Normal  Orientation:  Full (Time, Place, and Person)  Attention:  Good  Concentration:  good  Memory:  Immediate  Fund of knowledge:   Good  Insight:    Good  Judgment:   Good  Impulse Control:  good    Reported Symptoms: Depressed mood most days 5 or 6/10, decreased interest, anxiety at times/overwhelmed went over tasked  Risk Assessment: Danger to Self:   No Self-injurious Behavior: No Danger to Others: No Duty to Warn:no Physical Aggression / Violence:No  Access to Firearms a concern: No  Gang Involvement:No   Diagnosis:   ICD-10-CM   1. Generalized anxiety disorder F41.1      Plan:   1.  Patient to continue to engage in individual counseling 2-4 times a month or as needed. 2.  Patient to identify and apply CBT, coping skills learned in session to decrease depression and anxiety symptoms. 3.  Patient to contact this office, go to the local ED or call 911 if a crisis or emergency develops between visits.  [name] Session, [facility]

## 2018-10-12 ENCOUNTER — Ambulatory Visit: Payer: Self-pay | Admitting: Mental Health

## 2018-10-12 DIAGNOSIS — F411 Generalized anxiety disorder: Secondary | ICD-10-CM

## 2018-10-12 NOTE — Progress Notes (Signed)
      [facility] Counselor/Therapist Progress Note   Patient ID: Tony Hayes, MRN: [id number]  Date: 10/12/2018  Timespent: 54 minutes  Treatment Type: Individual  Mental Status Exam:   Appearance:   Casual     Behavior:  Appropriate  Motor:  Normal  Speech/Language:   Clear and Coherent  Affect:  Congruent  Mood:  depressed  Thought process:  Coherent and Relevant  Thought content:    Logical  Perceptual disturbances:    Normal  Orientation:  Full (Time, Place, and Person)  Attention:  Good  Concentration:  good  Memory:  Immediate  Fund of knowledge:   Good  Insight:    Good  Judgment:   Good  Impulse Control:  good    Reported Symptoms: Depressed mood most days 5 or 6/10, decreased interest, anxiety at times/overwhelmed went over tasked  Risk Assessment: Danger to Self:  No Self-injurious Behavior: No Danger to Others: No Duty to Warn:no Physical Aggression / Violence:No  Access to Firearms a concern: No  Gang Involvement:No  Patient / guardian was educated about steps to take if suicide or homicide risk level increases between visits. While future psychiatric events cannot be accurately predicted, the patient does not currently require acute inpatient psychiatric care and does not currently meet [facility] involuntary commitment criteria.  Subjective: Present on time for today's session in mild distress.  He shared progress since our last visit which was several weeks ago.  He stated that he and his fiance have broken up.  Most of the session was centered around recent life changes, this break-up.  Provided support, assisting patient in identifying helpful coping during this time that he is utilized and explored additional coping strategies with patient.  He continues to have strong support from his family.  Normalized some feelings he had about coping with multiple life adjustments recently.  Encouraged him to set goals daily toward self-care as well as  identifying some long-term goals he wants to set for himself.  Interventions:CBT  Diagnosis:   ICD-10-CM   1. Generalized anxiety disorder F41.1     Plan:   1.  Patient to continue to engage in individual counseling 2-4 times a month or as needed. 2.  Patient to identify and apply CBT, coping skills learned in session to decrease depression and anxiety symptoms. 3.  Patient to contact this office, go to the local ED or call 911 if a crisis or emergency develops between visits.  [name] Session, [facility]

## 2018-11-22 ENCOUNTER — Encounter: Payer: Self-pay | Admitting: Psychiatry

## 2018-11-22 ENCOUNTER — Other Ambulatory Visit: Payer: Self-pay

## 2018-11-22 ENCOUNTER — Ambulatory Visit (INDEPENDENT_AMBULATORY_CARE_PROVIDER_SITE_OTHER): Payer: Self-pay | Admitting: Psychiatry

## 2018-11-22 VITALS — BP 116/82 | HR 68 | Ht 69.5 in | Wt 215.0 lb

## 2018-11-22 DIAGNOSIS — F411 Generalized anxiety disorder: Secondary | ICD-10-CM | POA: Insufficient documentation

## 2018-11-22 DIAGNOSIS — F410 Panic disorder [episodic paroxysmal anxiety] without agoraphobia: Secondary | ICD-10-CM

## 2018-11-22 DIAGNOSIS — F340 Cyclothymic disorder: Secondary | ICD-10-CM | POA: Insufficient documentation

## 2018-11-22 DIAGNOSIS — F902 Attention-deficit hyperactivity disorder, combined type: Secondary | ICD-10-CM | POA: Insufficient documentation

## 2018-11-22 DIAGNOSIS — F88 Other disorders of psychological development: Secondary | ICD-10-CM | POA: Insufficient documentation

## 2018-11-22 MED ORDER — QUETIAPINE FUMARATE 50 MG PO TABS: 50.0000 mg | ORAL_TABLET | Freq: Every day | ORAL | 2 refills | Status: DC

## 2018-11-22 NOTE — Progress Notes (Signed)
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  Father requested minimum medical care last appointment as wife was retiring and he already retired so that Government social research officer were to be different.  The patient is unaware of the status of such medical matters for the family, however he notes that parents are not mindful of his drinking at night using alcohol to relieve his panic anxiety which may last four hours leaving it understood as generalized anxiety equivalent.  He did not restart testosterone or other steroids.  He is taking his usual regimen of Lamictal and Paxil however he finds his Valium is no longer helping the panic such that he asks if there is anything stronger or different that might help his confinement by anxiety, such as Xanax.  He does not describe mania or psychosis.  He has no dissociation or flashbacks, though there are many changes as his fianc separated and adoptive sister divorced having her children living in the home with his adoptive parents.  He has not found employment though every time he looks at a job description he gets the panic generalized somatic anxiety avoiding further job search and thereby phobic inhibiting him.  Parents generally exacerbate his anxiety by punitive worry of their own and relative alienation of his opportunities.  He does not play the guitar  or participate in other activities. Anxiety  Presents for follow-up visit. Symptoms include chest pain, decreased concentration, dry mouth, feeling of choking, hyperventilation, insomnia, irritability, muscle tension, nausea, palpitations, panic, restlessness and shortness of breath. Patient reports no confusion, depressed mood or suicidal ideas. Symptoms occur most days. The most recent episode lasted 4 hours. The severity of symptoms is interfering with daily activities, causing significant distress and incapacitating. The patient sleeps 4 hours per night. The quality of sleep is poor. Nighttime awakenings: several.   Compliance with medications is 76-100%. Side effects of treatment include GI discomfort.    Individual Medical History/ Review of Systems: Changes? :Yes Patient considers himself part of a very complex adoptive family with very young niece and nephews and old adoptive parents as though no one left to talk with except father questions him extensively so that patient's anxiety is considered father to be psychosis, frightening patient even more so that he seems to be questioning whether he might take Risperdal again as in remote past though adoptive mother always becomes very anxious if he has any side effects requiring adapting to the medication.  Allergies: Patient has no known allergies.  Current Medications:  Current Outpatient Medications:  .  diazepam (VALIUM) 10 MG tablet, Take 10 mg by mouth 2 (two) times daily as needed., Disp: , Rfl:  .  PARoxetine (PAXIL) 30 MG tablet, Take 30 mg by mouth at bedtime., Disp: , Rfl:  .  hydroquinone 4 % cream, Apply 1 application topically at bedtime., Disp: , Rfl:  .  lamoTRIgine (LAMICTAL) 150 MG tablet, Take 300 mg by mouth at bedtime., Disp: , Rfl:  .  meloxicam (MOBIC) 15 MG tablet, Take 15 mg by mouth daily., Disp: , Rfl:  .  Meth-Hyo-M Bl-Na Phos-Ph Sal (URIBEL) 118 MG CAPS, Take 1 capsule (118 mg total) by mouth 3 (three) times daily  between meals as needed., Disp: 120 capsule, Rfl: 3 .  ondansetron (ZOFRAN-ODT) 4 MG disintegrating tablet, Take 4 mg by mouth every 8 (eight) hours as needed for nausea. , Disp: , Rfl:  .  oxyCODONE-acetaminophen (ROXICET) 5-325 MG per tablet, Take 1 tablet by mouth every 4 (four) hours as needed for severe pain. (Patient taking differently: Take 0.5 tablets by mouth every 4 (four) hours as needed for severe pain (Pain). ), Disp: 30 tablet, Rfl: 0 .  phenazopyridine (PYRIDIUM) 200 MG tablet, Take 1 tablet (200 mg total) by mouth 3 (three) times daily as needed for pain., Disp: 30 tablet, Rfl: 3 .  tamsulosin (FLOMAX) 0.4 MG CAPS capsule, Take 0.4 mg by mouth daily., Disp: , Rfl: 0 .  tamsulosin (FLOMAX) 0.4 MG CAPS capsule, Take 1 capsule (0.4 mg total) by mouth daily., Disp: 30 capsule, Rfl: 0 .  traMADol-acetaminophen (ULTRACET) 37.5-325 MG per tablet, Take 1 tablet by mouth every 6 (six) hours as needed., Disp: 30 tablet, Rfl: 2 .  trimethoprim (TRIMPEX) 100 MG tablet, Take 1 tablet (100 mg total) by mouth 1 day or 1 dose., Disp: 30 tablet, Rfl: 1   Medication Side Effects: none  Family Medical/ Social History: Changes? Yes patient having likely aged out of any family insurance and likely using cheap alcohol to self medicate his anxiety.  He needs employment, social life, and exercise.  MENTAL HEALTH EXAM: Muscle strengths and tone 5/5, postural reflexes and gait 0/0, and AIMS = 0. Blood pressure 116/82, pulse 68, height 5' 9.5" (1.765 m), weight 215 lb (97.5 kg).Body mass index is 31.29 kg/m.  General Appearance: Casual, Disheveled, Guarded, Meticulous and Obese  Eye Contact:  Fair  Speech:  Clear and Coherent, Normal Rate and Talkative  Volume:  Normal  Mood:  Anxious, Dysphoric, Euthymic, Irritable and Worthless  Affect:  Inappropriate, Labile, Full Range and Anxious  Thought Process:  Disorganized, Goal Directed and Linear  Orientation:  Full (Time, Place, and Person)  Thought  Content: Ilusions, Obsessions, Paranoid Ideation and Rumination   Suicidal Thoughts:  No  Homicidal Thoughts:  No  Memory:  Immediate;   Fair Remote;   Good  Judgement:  Fair  Insight:  Fair and Lacking  Psychomotor Activity:  Normal, Increased, Mannerisms and Restlessness  Concentration:  Concentration: Fair and Attention Span: Fair  Recall:  [employer] of Knowledge: Good  Language: Good  Assets:  Desire for Improvement Resilience Talents/Skills  ADL's:  Intact  Cognition: WNL  Prognosis:  Good    DIAGNOSES:    ICD-10-CM   1. Panic disorder F41.0   2. 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 Secondary neurodevelopmental disorder F88     Receiving Psychotherapy: No Recently with Elio Forget, LPC last seen 10/12/2018   RECOMMENDATIONS: Patient has current 90 day and refill supplies at [facility] for paroxetine 30 mg every bedtime and Lamictal 150 mg taking 2 tablets total 300 mg every bedtime for GAD and cyclothymia that will also help panic.  He has current supply of Valium 10 mg twice daily as needed for panic or anxiety though currently not effective so that he may require Xanax instead if Seroquel  not resolving.  Quetiapine 50 mg every bedtime that can be fine-tuned by half tablet adjustments for relief of evening panic while also stabilizing mechanisms of panic, GAD, cyclothymia, and secondary neurodevelopmental disorder, sent as #30 with 2 refills to [facility] as we anticipate the response of adoptive parents.  He therefore may return in 3 months limited by parents relative to appointments while patient must be search of employment and independence noting that he has remorse and self-doubt concerns for depending upon parents as he does rather than individuating.   Chauncey Mann, MD

## 2018-12-29 ENCOUNTER — Other Ambulatory Visit: Payer: Self-pay

## 2018-12-29 ENCOUNTER — Encounter (HOSPITAL_COMMUNITY): Payer: Self-pay

## 2018-12-29 ENCOUNTER — Emergency Department (HOSPITAL_COMMUNITY)
Admission: EM | Admit: 2018-12-29 | Discharge: 2018-12-30 | Disposition: A | Discharge status: Home / Self Care | Payer: Self-pay | Attending: Emergency Medicine | Admitting: Emergency Medicine

## 2018-12-29 ENCOUNTER — Emergency Department (HOSPITAL_COMMUNITY): Payer: Self-pay

## 2018-12-29 DIAGNOSIS — Y999 Unspecified external cause status: Secondary | ICD-10-CM | POA: Insufficient documentation

## 2018-12-29 DIAGNOSIS — Z23 Encounter for immunization: Secondary | ICD-10-CM | POA: Insufficient documentation

## 2018-12-29 DIAGNOSIS — S61214A Laceration without foreign body of right ring finger without damage to nail, initial encounter: Secondary | ICD-10-CM | POA: Insufficient documentation

## 2018-12-29 DIAGNOSIS — W260XXA Contact with knife, initial encounter: Secondary | ICD-10-CM | POA: Insufficient documentation

## 2018-12-29 DIAGNOSIS — Z79899 Other long term (current) drug therapy: Secondary | ICD-10-CM | POA: Insufficient documentation

## 2018-12-29 DIAGNOSIS — Y92009 Unspecified place in unspecified non-institutional (private) residence as the place of occurrence of the external cause: Secondary | ICD-10-CM | POA: Insufficient documentation

## 2018-12-29 DIAGNOSIS — N189 Chronic kidney disease, unspecified: Secondary | ICD-10-CM | POA: Insufficient documentation

## 2018-12-29 DIAGNOSIS — F17290 Nicotine dependence, other tobacco product, uncomplicated: Secondary | ICD-10-CM | POA: Insufficient documentation

## 2018-12-29 DIAGNOSIS — Y939 Activity, unspecified: Secondary | ICD-10-CM | POA: Insufficient documentation

## 2018-12-29 DIAGNOSIS — F331 Major depressive disorder, recurrent, moderate: Secondary | ICD-10-CM | POA: Insufficient documentation

## 2018-12-29 MED ORDER — LIDOCAINE HCL (PF) 1 % IJ SOLN
5.0000 mL | Freq: Once | INTRAMUSCULAR | Status: AC
  Administered 2018-12-29: 5 mL
  Filled 2018-12-29: qty 30

## 2018-12-29 MED ORDER — TETANUS-DIPHTH-ACELL PERTUSSIS 5-2.5-18.5 LF-MCG/0.5 IM SUSP
0.5000 mL | Freq: Once | INTRAMUSCULAR | Status: AC
  Administered 2018-12-29: 0.5 mL via INTRAMUSCULAR
  Filled 2018-12-29: qty 0.5

## 2018-12-29 MED ORDER — BUPIVACAINE HCL (PF) 0.25 % IJ SOLN
5.0000 mL | Freq: Once | INTRAMUSCULAR | Status: AC
  Administered 2018-12-29: 5 mL
  Filled 2018-12-29: qty 30

## 2018-12-29 NOTE — ED Notes (Signed)
Xylocaine and Marcaine placed at bedside for provider.

## 2018-12-29 NOTE — ED Triage Notes (Addendum)
Per EMS, patient coming from home with complaints of a laceration to the right ring finger after slamming his hand down on the table while holding a steak knife. ETOH on board. Patient denies SI or HI, stating that he just got angry. He intends to seek behavioral health help after he is no longer intoxicated.   Patient lives with someone who is currently under investigation for covid-19. Patient has no symptoms currently.

## 2018-12-29 NOTE — ED Notes (Signed)
Pt has a mask on. Pt was given one upon arrival from EMS

## 2018-12-29 NOTE — ED Notes (Signed)
Consult to Social Work @21 :43pm.

## 2018-12-29 NOTE — ED Notes (Signed)
Bed: WA21 Expected date:  Expected time:  Means of arrival:  Comments: 89M R ring finger lac

## 2018-12-29 NOTE — Progress Notes (Signed)
Consult request has been received. CSW attempting to follow up at present time.  CSW spoke to pt's EDP who stated pt needs resources for depression and possible alcohol/SUD TX needs, as well as other stressors.  CSW provided resources for [facility] (alcohol tx), [facility] (psyche meds/therapy) [facility] [employer] (medical needs) as well as [facility] [place] for therapy.  Pt needs no insurances for the above.  CSW also provided online AA meeting schedule link, as well as inpatient/outpatient SUD TX options and provided these to the EDP.  CSW will continue to follow for D/C needs.  Dorothe Pea. Kisean Rollo, LCSW, LCAS, CSI Transitions of Care Clinical Social Worker Care Coordination Department Ph: [phone]  York Grice, Alexander Mt, LCAS Clinical Social Worker [phone]

## 2018-12-30 NOTE — Progress Notes (Signed)
Per Nira Conn, NP pt is psych cleared and does not meet criteria for inpt treatment. Pt is recommended to f/u with OPT providers in order to establish ongoing MH treatment. EDP Rob, PA and pt's nurse Leta Jungling, RN have been advised. TTS spoke with the pt's parents who state they are willing to pick up the pt once he is d/c.  Princess Bruins, MSW, LCSW Therapeutic Triage Specialist  [phone]

## 2018-12-30 NOTE — BH Assessment (Addendum)
Tele Assessment Note   Patient Name: Tony Hayes MRN: [id number] Referring Physician: Delia Hayes Location of Patient: [facility] Location of Provider: Behavioral Health TTS Department  Tony Hayes is an 28 y.o. male who presents to the ED voluntarily. Pt states he accidentally cut his finger PTA after an argument with his family. Pt states he became upset at dinner due to something his mother said to him and he slammed his had on the table while he was holding a steak knife. Pt states he began to bleed heavily and came to the ED. Pt denies SI, HI, and denies AVH. Pt reports he has been increasingly depressed due to feeling alone and isolated since being "quarantined." Pt states he has no significant other and he sees his friends getting married and moving out. Pt states he feels lonely and less adequate because he still lives with his parents, he is not working, and has been isolated due to the current pandemic. Pt states he does not have a current provider because his insurance ran out and he has not seen a therapist in 4 months. Pt states he still sees his psychiatrist for medication management because his parents support him financially in regards to his psychiatrist.   Pt reports a hx of inpt treatment in 2009 due to cutting behaviors. Pt states he has not engaged in self harm since that time. Pt states he has been drinking more heavily due to feeling bored and alone. Pt states he drinks up to 8-9 shots of liquor each day. Pt provides verbal consent for TTS to speak with his parents and states "but don't tell them I've been drinking."  TTS contacted the pt's mother at [phone] and she states the pt has a hx of Bipolar d/o. Mom states the pt has been increasingly agitated but she does not have any concerns for herself or the pt. Mom states the pt became frantic today when he cut himself because he thought he cut an artery due to there being so much blood. Mother states the pt  has a "fear of dying" and is not concerned that he would intentionally harm himself.    Per Tony Conn, NP pt is psych cleared and does not meet criteria for inpt treatment. Pt is recommended to f/u with OPT providers in order to establish ongoing MH treatment. EDP Rob, PA and pt's nurse Tony Jungling, RN have been advised. TTS spoke with the pt's parents who state they are willing to pick up the pt once he is d/c.  Diagnosis: MDD, recurrent, moderate, w/o psychosis; GAD, moderate; Alcohol use d/o, severe  Past Medical History:  Past Medical History:  Diagnosis Date  . Anxiety   . Bipolar disorder (HCC)   . Chronic kidney disease    kidney stone  . Depression   . Headache   . Heart murmur 5 years ago   mother states she doesn't know name for heart condition, possible murmer    Past Surgical History:  Procedure Laterality Date  . CYSTOSCOPY WITH RETROGRADE PYELOGRAM, URETEROSCOPY AND STENT PLACEMENT Right 09/05/2014   Procedure: CYSTOSCOPY WITH RIGHT RETROGRADE PYELOGRAM AND STENT PLACEMENT;  Surgeon: Tony Ludwig, MD;  Location: [facility];  Service: Urology;  Laterality: Right;  . CYSTOSCOPY WITH RETROGRADE PYELOGRAM, URETEROSCOPY AND STENT PLACEMENT Right 10/21/2014   Procedure: CYSTOSCOPY WITH RIGHT RETROGRADE PYELOGRAM, RIGHT URETEROSCOPY AND  STONE EXTRACTION ,STENT PLACEMENT;  Surgeon: Tony Ludwig, MD;  Location: [facility];  Service: Urology;  Laterality: Right;  .  HOLMIUM LASER APPLICATION Right 10/21/2014   Procedure: HOLMIUM LASER ;  Surgeon: Tony LudwigSigmund I Tannenbaum, MD;  Location: [facility];  Service: Urology;  Laterality: Right;  . undescended testicle     surgery to correct at 28 year old  . WISDOM TOOTH EXTRACTION     all removed at age 89    Family History: History reviewed. No pertinent family history.  Social History:  reports that he has been smoking e-cigarettes. He has smoked for the past 1.00 year. He has never used smokeless tobacco. He reports current drug use. Drug:  Other-see comments. He reports that he does not drink alcohol.  Additional Social History:  Alcohol / Drug Use Pain Medications: See MAR Prescriptions: See MAR Over the Counter: See MAR History of alcohol / drug use?: Yes Withdrawal Symptoms: Patient aware of relationship between substance abuse and physical/medical complications Substance #1 Name of Substance 1: Alcohol 1 - Age of First Use: 27 1 - Amount (size/oz): excessive 1 - Frequency: daily 1 - Duration: ongoing 1 - Last Use / Amount: 12/29/18  CIWA: CIWA-Ar BP: 125/82 Pulse Rate: 100 COWS:    Allergies: No Known Allergies  Home Medications: (Not in a hospital admission)   OB/GYN Status:  No LMP for male patient.  General Assessment Data Location of Assessment: [facility] TTS Assessment: In system Is this a Tele or Face-to-Face Assessment?: Tele Assessment Is this an Initial Assessment or a Re-assessment for this encounter?: Initial Assessment Patient Accompanied by:: N/A Language Other than English: No Living Arrangements: Other (Comment) What gender do you identify as?: Male Marital status: Single Pregnancy Status: No Living Arrangements: Parent Can pt return to current living arrangement?: Yes Admission Status: Voluntary Is patient capable of signing voluntary admission?: Yes Referral Source: Self/Family/Friend Insurance type: none     Crisis Care Plan Living Arrangements: Parent Name of Psychiatrist: [facility] Name of Therapist: none  Education Status Is patient currently in school?: No Is the patient employed, unemployed or receiving disability?: Unemployed  Risk to self with the past 6 months Suicidal Ideation: No Has patient been a risk to self within the past 6 months prior to admission? : Yes(cut self with steak knife by accident) Suicidal Intent: No Has patient had any suicidal intent within the past 6 months prior to admission? : No Is patient at risk for suicide?: No Suicidal Plan?:  No Has patient had any suicidal plan within the past 6 months prior to admission? : No Access to Means: No What has been your use of drugs/alcohol within the last 12 months?: alcohol Previous Attempts/Gestures: No Triggers for Past Attempts: None known Intentional Self Injurious Behavior: Cutting Comment - Self Injurious Behavior: hx of cutting in 2009 Family Suicide History: Unknown Recent stressful life event(s): Conflict (Comment)(argue with parents) Persecutory voices/beliefs?: No Depression: Yes Depression Symptoms: Feeling angry/irritable, Feeling worthless/self pity Substance abuse history and/or treatment for substance abuse?: Yes Suicide prevention information given to non-admitted patients: Not applicable  Risk to Others within the past 6 months Homicidal Ideation: No Does patient have any lifetime risk of violence toward others beyond the six months prior to admission? : No Thoughts of Harm to Others: No Current Homicidal Intent: No Current Homicidal Plan: No Access to Homicidal Means: No History of harm to others?: No Assessment of Violence: None Noted Does patient have access to weapons?: No Criminal Charges Pending?: No Does patient have a court date: No Is patient on probation?: No  Psychosis Hallucinations: None noted Delusions: None noted  Mental  Status Report Appearance/Hygiene: Unremarkable Eye Contact: Good Motor Activity: Freedom of movement Speech: Logical/coherent Level of Consciousness: Alert Mood: Euthymic, Anxious, Depressed Affect: Appropriate to circumstance, Anxious, Depressed Anxiety Level: Moderate Thought Processes: Coherent, Relevant Judgement: Partial Orientation: Person, Place, Time, Situation, Appropriate for developmental age Obsessive Compulsive Thoughts/Behaviors: None  Cognitive Functioning Concentration: Normal Memory: Remote Intact, Recent Intact Is patient IDD: No Insight: Fair Impulse Control: Poor Appetite: Good Have  you had any weight changes? : No Change Sleep: No Change Total Hours of Sleep: 8 Vegetative Symptoms: None  ADLScreening [facility] Assessment Services) Patient's cognitive ability adequate to safely complete daily activities?: Yes Patient able to express need for assistance with ADLs?: Yes Independently performs ADLs?: Yes (appropriate for developmental age)  Prior Inpatient Therapy Prior Inpatient Therapy: Yes Prior Therapy Dates: 2009 Prior Therapy Facilty/Provider(s): [facility] Reason for Treatment: SELF-HARM  Prior Outpatient Therapy Prior Outpatient Therapy: Yes Prior Therapy Dates: ONGOING Prior Therapy Facilty/Provider(s): [facility] Reason for Treatment: MED MANAGEMENT Does patient have an ACCT team?: No Does patient have Intensive In-House Services?  : No Does patient have [facility] services? : No Does patient have P4CC services?: No  ADL Screening (condition at time of admission) Patient's cognitive ability adequate to safely complete daily activities?: Yes Is the patient deaf or have difficulty hearing?: No Does the patient have difficulty seeing, even when wearing glasses/contacts?: No Does the patient have difficulty concentrating, remembering, or making decisions?: No Patient able to express need for assistance with ADLs?: Yes Does the patient have difficulty dressing or bathing?: No Independently performs ADLs?: Yes (appropriate for developmental age) Does the patient have difficulty walking or climbing stairs?: No Weakness of Legs: None Weakness of Arms/Hands: None  Home Assistive Devices/Equipment Home Assistive Devices/Equipment: Eyeglasses    Abuse/Neglect Assessment (Assessment to be complete while patient is alone) Abuse/Neglect Assessment Can Be Completed: Yes Physical Abuse: Denies Verbal Abuse: Denies Sexual Abuse: Denies Exploitation of patient/patient's resources: Denies Self-Neglect: Denies     Merchant navy officer (For Healthcare) Does Patient Have a  Medical Advance Directive?: No Would patient like information on creating a medical advance directive?: No - Patient declined          Disposition: Per Tony Conn, NP pt is psych cleared and does not meet criteria for inpt treatment. Pt is recommended to f/u with OPT providers in order to establish ongoing MH treatment. EDP Rob, PA and pt's nurse Tony Jungling, RN have been advised. TTS spoke with the pt's parents who state they are willing to pick up the pt once he is d/c.  Disposition Initial Assessment Completed for this Encounter: Yes Disposition of Patient: Discharge Patient refused recommended treatment: No Mode of transportation if patient is discharged/movement?: Car  This service was provided via telemedicine using a 2-way, interactive audio and video technology.  Names of all persons participating in this telemedicine service and their role in this encounter. Name: Tony Hayes Role: Patient  Name: Princess Bruins Role: TTS          Karolee Ohs 12/30/2018 2:42 AM

## 2018-12-30 NOTE — Discharge Instructions (Addendum)
Please see the information and instructions below regarding your visit.  Your diagnoses today include:  1. Laceration of right ring finger without foreign body without damage to nail, initial encounter     Tests performed today include: X-ray of the affected area that did not show any foreign bodies or broken bones Vital signs. See below for your results today.   Medications prescribed:   Take any prescribed medications only as directed.  Ibuprofen alternating with Tylenol for pain.   Home care instructions:  Follow any educational materials and wound care instructions contained in this packet.   Keep affected area above the level of your heart when possible to minimize swelling. Wash area gently twice a day with warm soapy water. Do not apply alcohol or hydrogen peroxide directly over a wound. Cover the area if it is draining or weeping. Keep the bandage in place for 24 hours and refrain from getting the wound wet for 24 hours. After that, you may get the area wet, but please ensure that you dry it completely afterwards.  Please refrain from soaking sutures for long periods of time, or swimming in chlorinated water.   You may apply antibiotic ointment such as Bacitracin or Neosporin.  Follow-up instructions: Suture Removal: Return to the Emergency Department or see your primary care care doctor in 7 days for a recheck of your wound and removal of your sutures or staples.    Return instructions:  Return to the Emergency Department if you have: Fever Worsening pain Worsening swelling of the wound Pus draining from the wound Redness of the skin that moves away from the wound, especially if it streaks away from the affected area  Any other emergent concerns  Your vital signs today were: BP 125/82 (BP Location: Left Arm)    Pulse 100    Temp 98.2 F (36.8 C) (Oral)    Resp 19    Ht 5\' 9"  (1.753 m)    Wt 99.8 kg    SpO2 96%    BMI 32.49 kg/m  If your blood pressure (BP) was  elevated on multiple readings during this visit above 130 for the top number or above 80 for the bottom number, please have this repeated by your primary care provider within one month. --------------  Thank you for allowing [place] to participate in your care today! It was a pleasure taking care of you.

## 2018-12-30 NOTE — ED Provider Notes (Signed)
[facility]   CSN: [id number] Arrival date & time: 12/29/18  2100    History   Chief Complaint Chief Complaint  Patient presents with  . Extremity Laceration    Right Ring Finger     HPI Tony Hayes is a 28 y.o. male.     HPI  Patient is a 28 yo male with a history of bipolar disorder, headaches, kidney stones, presenting for laceration to the ring finger of the right finger. Patient reports that he was drinking today more heavily than usual and became agitated at something his mother said at dinner. He reports that he had a bread knife in his hand at the time and slammed it on the table, lacerating his hand in the process.  Patient reports that this was not an attempt at self-harm.  Patient reports that he immediately regretted hitting his hand.  Patient reports that he did not look at the wound and saw a lot of blood in his father called EMS.  He reports that his father was concerned about his behavior and thought that he needed an evaluation for his behavior.  Patient denies any thoughts of self-harm or harming others.  Patient does report that he is drinking more during the COVID-19 quarantine due to increased depression.  Patient reports that he quit his job just prior to the COVID-19 pandemic, and now has having difficulty finding a job.  He is also uninsured.  Unknown last Tdap.  Patient's father was independently contacted and he stated that the patient was very agitated today and has had "a mood".  He denies hearing the patient make any specific threats towards himself or others.  He was mainly concerned about the patient's behavior because his grandchildren were in the home.   Past Medical History:  Diagnosis Date  . Anxiety   . Bipolar disorder (HCC)   . Chronic kidney disease    kidney stone  . Depression   . Headache   . Heart murmur 5 years ago   mother states she doesn't know name for heart condition, possible  murmer    Patient Active Problem List   Diagnosis Date Noted  . Generalized anxiety disorder 11/22/2018  . 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 CYSTOSCOPY WITH RETROGRADE PYELOGRAM, URETEROSCOPY AND STENT PLACEMENT Right 09/05/2014   Procedure: CYSTOSCOPY WITH RIGHT RETROGRADE PYELOGRAM AND STENT PLACEMENT;  Surgeon: Kathi LudwigSigmund I Tannenbaum, MD;  Location: [facility];  Service: Urology;  Laterality: Right;  . CYSTOSCOPY WITH RETROGRADE PYELOGRAM, URETEROSCOPY AND STENT PLACEMENT Right 10/21/2014   Procedure: CYSTOSCOPY WITH RIGHT RETROGRADE PYELOGRAM, RIGHT URETEROSCOPY AND  STONE EXTRACTION ,STENT PLACEMENT;  Surgeon: Kathi LudwigSigmund I Tannenbaum, MD;  Location: [facility];  Service: Urology;  Laterality: Right;  . HOLMIUM LASER APPLICATION Right 10/21/2014   Procedure: HOLMIUM LASER ;  Surgeon: Kathi LudwigSigmund I Tannenbaum, MD;  Location: [facility];  Service: Urology;  Laterality: Right;  . undescended testicle     surgery to correct at 28 year old  . WISDOM TOOTH EXTRACTION     all removed at age 28        Home Medications    Prior to Admission medications   Medication Sig Start Date End Date Taking? Authorizing Provider  diazepam (VALIUM) 10 MG tablet Take 10 mg by mouth 2 (two) times daily as needed. 02/01/18  [provider]  hydroquinone 4 % cream Apply 1 application topically at bedtime.    [provider]  lamoTRIgine (LAMICTAL) 150 MG tablet Take 300 mg by mouth at bedtime.    [provider]  meloxicam (MOBIC) 15 MG tablet Take 15 mg by mouth daily.    [provider]  Meth-Hyo-M Bl-Na Phos-Ph Sal (URIBEL) 118 MG CAPS Take 1 capsule (118 mg total) by mouth 3 (three) times daily between meals as needed. 09/05/14   Jethro Bolus, MD  ondansetron (ZOFRAN-ODT) 4 MG disintegrating  tablet Take 4 mg by mouth every 8 (eight) hours as needed for nausea.     [provider]  oxyCODONE-acetaminophen (ROXICET) 5-325 MG per tablet Take 1 tablet by mouth every 4 (four) hours as needed for severe pain. Patient taking differently: Take 0.5 tablets by mouth every 4 (four) hours as needed for severe pain (Pain).  09/05/14   Jethro Bolus, MD  PARoxetine (PAXIL) 30 MG tablet Take 30 mg by mouth at bedtime. 07/18/17   [provider]  phenazopyridine (PYRIDIUM) 200 MG tablet Take 1 tablet (200 mg total) by mouth 3 (three) times daily as needed for pain. 10/21/14   Jethro Bolus, MD  QUEtiapine (SEROQUEL) 50 MG tablet Take 1 tablet (50 mg total) by mouth at bedtime. 11/22/18   Chauncey Mann, MD  tamsulosin (FLOMAX) 0.4 MG CAPS capsule Take 0.4 mg by mouth daily. 09/19/14   [provider]  tamsulosin (FLOMAX) 0.4 MG CAPS capsule Take 1 capsule (0.4 mg total) by mouth daily. 10/21/14   Jethro Bolus, MD  traMADol-acetaminophen (ULTRACET) 37.5-325 MG per tablet Take 1 tablet by mouth every 6 (six) hours as needed. 10/21/14   Jethro Bolus, MD  trimethoprim (TRIMPEX) 100 MG tablet Take 1 tablet (100 mg total) by mouth 1 day or 1 dose. 10/21/14   Jethro Bolus, MD    Family History History reviewed. No pertinent family history.  Social History Social History   Tobacco Use  . Smoking status: Current Every Day Smoker    Years: 1.00    Types: E-cigarettes  . Smokeless tobacco: Never Used  Substance Use Topics  . Alcohol use: No    Alcohol/week: 0.0 standard drinks  . Drug use: Yes    Types: Other-see comments    Comment: bath salts -  crystal meth  4 yrs ago     Allergies   Patient has no known allergies.   Review of Systems Review of Systems  Musculoskeletal: Positive for arthralgias.  Skin: Positive for wound.  Neurological: Negative for weakness and numbness.  Psychiatric/Behavioral: Positive for agitation. Negative for  dysphoric mood and suicidal ideas. The patient is not nervous/anxious.   All other systems reviewed and are negative.    Physical Exam Updated Vital Signs BP 119/71 (BP Location: Left Arm)   Pulse 96   Temp 98.2 F (36.8 C) (Oral)   Resp 20   Ht 5\' 9"  (1.753 m)   Wt 99.8 kg   SpO2 96%   BMI 32.49 kg/m   Physical Exam Vitals signs and nursing note reviewed.  Constitutional:      General: He is not in acute distress.    Appearance: Normal appearance. He is well-developed. He is not ill-appearing or diaphoretic.     Comments: Non-agitated, pleasant, and conversant.   HENT:     Head: Normocephalic and atraumatic.  Eyes:     General:        Right eye: No discharge.  Left eye: No discharge.     Conjunctiva/sclera: Conjunctivae normal.     Comments: EOMs normal to gross examination.  Neck:     Musculoskeletal: Normal range of motion.  Cardiovascular:     Rate and Rhythm: Normal rate and regular rhythm.     Comments: Intact, 2+ radial pulse. Pulmonary:     Comments: Converses comfortably without audible wheeze or stridor. Abdominal:     General: There is no distension.  Musculoskeletal: Normal range of motion.     Comments: Patient's right ring finger exhibits a laceration just proximal to the DIP joint.  No tendon exposure.  Patient has full capacity of flexion and extension of the finger.  Patient has full sensation distally.  2+ capillary refill in the tip of the finger.  Skin:    General: Skin is warm and dry.  Neurological:     Mental Status: He is alert.     Comments: Cranial nerves intact to gross observation. Patient moves extremities without difficulty.  Psychiatric:     Comments: Normal affect.  Patient not expressing suicidal ideas on exam.  Not agitated.      ED Treatments / Results  Labs (all labs ordered are listed, but only abnormal results are displayed) Labs Reviewed - No data to display  EKG None  Radiology Dg Hand Complete Right   Result Date: 12/29/2018 CLINICAL DATA:  28 y/o  M; laceration to the right ring finger. EXAM: RIGHT HAND - COMPLETE 3+ VIEW COMPARISON:  None. FINDINGS: There is no evidence of fracture or dislocation. There is no evidence of arthropathy or other focal bone abnormality. No radiopaque foreign body identified. IMPRESSION: No radiopaque foreign body identified. No acute fracture or dislocation. Electronically Signed   By: Mitzi Hansen M.D.   On: 12/29/2018 22:03    Procedures .Marland KitchenLaceration Repair Date/Time: 12/30/2018 12:58 AM Performed by: Elisha Ponder, PA-C Authorized by: Elisha Ponder, PA-C   Consent:    Consent obtained:  Verbal   Consent given by:  Patient   Risks discussed:  Infection and pain Anesthesia (see MAR for exact dosages):    Anesthesia method:  Nerve block   Block needle gauge:  25 G   Block anesthetic:  Lidocaine 1% w/o epi and bupivacaine 0.25% w/o epi   Block injection procedure:  Anatomic landmarks identified, introduced needle, incremental injection, negative aspiration for blood and anatomic landmarks palpated   Block outcome:  Anesthesia achieved Laceration details:    Location:  Finger   Length (cm):  2 Repair type:    Repair type:  Simple Pre-procedure details:    Preparation:  Patient was prepped and draped in usual sterile fashion Exploration:    Hemostasis achieved with:  Tourniquet and direct pressure   Wound exploration: wound explored through full range of motion     Wound extent: no nerve damage noted and no tendon damage noted     Contaminated: no   Treatment:    Area cleansed with:  Betadine   Amount of cleaning:  Standard   Irrigation solution:  Sterile saline   Irrigation volume:  1000 ml   Irrigation method:  Syringe Skin repair:    Repair method:  Sutures   Suture size:  4-0   Wound skin closure material used: [employer] rapide.   Suture technique:  Simple interrupted   Number of sutures:  3 Approximation:    Approximation:   Close Post-procedure details:    Dressing:  Non-adherent dressing and splint for protection  Patient tolerance of procedure:  Tolerated well, no immediate complications   (including critical care time)  Medications Ordered in ED Medications  Tdap (BOOSTRIX) injection 0.5 mL (0.5 mLs Intramuscular Given 12/29/18 2222)  lidocaine (PF) (XYLOCAINE) 1 % injection 5 mL (5 mLs Infiltration Given by Other 12/29/18 2332)  bupivacaine (PF) (MARCAINE) 0.25 % injection 5 mL (5 mLs Infiltration Given by Other 12/29/18 2332)     Initial Impression / Assessment and Plan / ED Course  I have reviewed the triage vital signs and the nursing notes.  Pertinent labs & imaging results that were available during my care of the patient were reviewed by me and considered in my medical decision making (see chart for details).        Patient is well-appearing and in no acute distress.  He is neurovascularly intact in the right ring finger.  Patient initially intoxicated on arrival, however he achieved clinical sobriety during emergency department course.  He is not agitated.  He is not expressing any suicidal ideas.  He does report that he is experiencing increased depression due to not being able to work, and being isolated at home.  He reports that he regrets his choices this evening.  Patient gave permission to speak with his father about the events of tonight.  His father did express concern over the degree of patient's agitation.  He reports that patient is decreasingly able to get mental health care due to lack of insurance, and they are not able to afford all of the visits.  Will have patient seen by TTS.  Appreciate their involvement.  CSW, Tony Hayes provided resources for outpatient substance use and mental health resources.  Appreciate his involvement.  Patient's finger was cleansed and closed primarily by me.  Splint placed for protection.  Patient directed to return or go to urgent care in 7 days for  suture removal.  Return precautions given for any increasing pain, swelling, erythema, or drainage.  Patient is in understanding and agrees with plan of care.  Final Clinical Impressions(s) / ED Diagnoses   Final diagnoses:  Laceration of right ring finger without foreign body without damage to nail, initial encounter    ED Discharge Orders    None       Delia Chimes 12/30/18 0100    Pricilla Loveless, MD 01/01/19 [phone]

## 2018-12-30 NOTE — ED Provider Notes (Signed)
Cleared by psych for discharge.   Roxy Horseman, PA-C 12/30/18 5051    Pricilla Loveless, MD 01/01/19 [phone]

## 2018-12-30 NOTE — ED Notes (Signed)
TTS machine at bedside. 

## 2019-01-08 ENCOUNTER — Encounter (HOSPITAL_COMMUNITY): Payer: Self-pay | Admitting: Emergency Medicine

## 2019-01-08 ENCOUNTER — Emergency Department (HOSPITAL_COMMUNITY)
Admission: EM | Admit: 2019-01-08 | Discharge: 2019-01-08 | Disposition: A | Discharge status: Home / Self Care | Payer: Self-pay | Attending: Emergency Medicine | Admitting: Emergency Medicine

## 2019-01-08 ENCOUNTER — Other Ambulatory Visit: Payer: Self-pay

## 2019-01-08 DIAGNOSIS — Z4802 Encounter for removal of sutures: Secondary | ICD-10-CM

## 2019-01-08 DIAGNOSIS — Y929 Unspecified place or not applicable: Secondary | ICD-10-CM | POA: Insufficient documentation

## 2019-01-08 DIAGNOSIS — Z79899 Other long term (current) drug therapy: Secondary | ICD-10-CM | POA: Insufficient documentation

## 2019-01-08 DIAGNOSIS — X58XXXD Exposure to other specified factors, subsequent encounter: Secondary | ICD-10-CM | POA: Insufficient documentation

## 2019-01-08 DIAGNOSIS — Z791 Long term (current) use of non-steroidal anti-inflammatories (NSAID): Secondary | ICD-10-CM | POA: Insufficient documentation

## 2019-01-08 DIAGNOSIS — S61219D Laceration without foreign body of unspecified finger without damage to nail, subsequent encounter: Secondary | ICD-10-CM | POA: Insufficient documentation

## 2019-01-08 DIAGNOSIS — Y939 Activity, unspecified: Secondary | ICD-10-CM | POA: Insufficient documentation

## 2019-01-08 DIAGNOSIS — F17290 Nicotine dependence, other tobacco product, uncomplicated: Secondary | ICD-10-CM | POA: Insufficient documentation

## 2019-01-08 DIAGNOSIS — Y999 Unspecified external cause status: Secondary | ICD-10-CM | POA: Insufficient documentation

## 2019-01-08 NOTE — Discharge Instructions (Addendum)
Return to the emergency department for redness, increased pain, pus draining from the wound, or other new and concerning symptoms.

## 2019-01-08 NOTE — ED Triage Notes (Signed)
Patient reports needing sutures removed from right fourth finger placed on 4/17.

## 2019-01-08 NOTE — ED Notes (Signed)
Bed: WTR6 Expected date:  Expected time:  Means of arrival:  Comments: 

## 2019-01-11 NOTE — ED Provider Notes (Signed)
[facility] Provider Note   CSN: [id number] Arrival date & time: 01/08/19  1413    History   Chief Complaint Chief Complaint  Patient presents with  . Suture / Staple Removal    HPI Tony Hayes is a 28 y.o. male.     Patient is a 28 year old male presenting for suture removal.  He had stitches placed 1 week ago in his finger.  Patient has no complaints.  The history is provided by the patient.  Suture / Staple Removal  This is a new problem. Nothing aggravates the symptoms. Nothing relieves the symptoms.    Past Medical History:  Diagnosis Date  . Anxiety   . Bipolar disorder (HCC)   . Chronic kidney disease    kidney stone  . Depression   . Headache   . Heart murmur 5 years ago   mother states she doesn't know name for heart condition, possible murmer    Patient Active Problem List   Diagnosis Date Noted  . Generalized anxiety disorder 11/22/2018  . 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 CYSTOSCOPY WITH RETROGRADE PYELOGRAM, URETEROSCOPY AND STENT PLACEMENT Right 09/05/2014   Procedure: CYSTOSCOPY WITH RIGHT RETROGRADE PYELOGRAM AND STENT PLACEMENT;  Surgeon: Kathi Ludwig, MD;  Location: [facility];  Service: Urology;  Laterality: Right;  . CYSTOSCOPY WITH RETROGRADE PYELOGRAM, URETEROSCOPY AND STENT PLACEMENT Right 10/21/2014   Procedure: CYSTOSCOPY WITH RIGHT RETROGRADE PYELOGRAM, RIGHT URETEROSCOPY AND  STONE EXTRACTION ,STENT PLACEMENT;  Surgeon: Kathi Ludwig, MD;  Location: [facility];  Service: Urology;  Laterality: Right;  . HOLMIUM LASER APPLICATION Right 10/21/2014   Procedure: HOLMIUM LASER ;  Surgeon: Kathi Ludwig, MD;  Location: [facility];  Service: Urology;  Laterality: Right;  . undescended testicle     surgery to  correct at 28 year old  . WISDOM TOOTH EXTRACTION     all removed at age 64        Home Medications    Prior to Admission medications   Medication Sig Start Date End Date Taking? Authorizing Provider  diazepam (VALIUM) 10 MG tablet Take 10 mg by mouth 2 (two) times daily as needed. 02/01/18   [provider]  hydroquinone 4 % cream Apply 1 application topically at bedtime.    [provider]  lamoTRIgine (LAMICTAL) 150 MG tablet Take 300 mg by mouth at bedtime.    [provider]  meloxicam (MOBIC) 15 MG tablet Take 15 mg by mouth daily.    [provider]  Meth-Hyo-M Bl-Na Phos-Ph Sal (URIBEL) 118 MG CAPS Take 1 capsule (118 mg total) by mouth 3 (three) times daily between meals as needed. 09/05/14   Jethro Bolus, MD  ondansetron (ZOFRAN-ODT) 4 MG disintegrating tablet Take 4 mg by mouth every 8 (eight) hours as needed for nausea.     [provider]  oxyCODONE-acetaminophen (ROXICET) 5-325 MG per tablet Take 1 tablet by mouth every 4 (four) hours as needed for severe pain. Patient taking differently: Take 0.5 tablets by mouth every 4 (four) hours as needed for severe pain (Pain).  09/05/14   Jethro Bolus, MD  PARoxetine (PAXIL) 30 MG tablet Take 30 mg by mouth at bedtime. 07/18/17   [provider]  phenazopyridine (PYRIDIUM) 200 MG tablet Take 1 tablet (200 mg total) by mouth 3 (three) times daily  as needed for pain. 10/21/14   Jethro Bolusannenbaum, Sigmund, MD  QUEtiapine (SEROQUEL) 50 MG tablet Take 1 tablet (50 mg total) by mouth at bedtime. 11/22/18   Chauncey MannJennings, Glenn E, MD  tamsulosin (FLOMAX) 0.4 MG CAPS capsule Take 0.4 mg by mouth daily. 09/19/14   [provider]  tamsulosin (FLOMAX) 0.4 MG CAPS capsule Take 1 capsule (0.4 mg total) by mouth daily. 10/21/14   Jethro Bolusannenbaum, Sigmund, MD  traMADol-acetaminophen (ULTRACET) 37.5-325 MG per tablet Take 1 tablet by mouth every 6 (six) hours as needed. 10/21/14   Jethro Bolusannenbaum, Sigmund, MD   trimethoprim (TRIMPEX) 100 MG tablet Take 1 tablet (100 mg total) by mouth 1 day or 1 dose. 10/21/14   Jethro Bolusannenbaum, Sigmund, MD    Family History No family history on file.  Social History Social History   Tobacco Use  . Smoking status: Current Every Day Smoker    Years: 1.00    Types: E-cigarettes  . Smokeless tobacco: Never Used  Substance Use Topics  . Alcohol use: No    Alcohol/week: 0.0 standard drinks  . Drug use: Yes    Types: Other-see comments    Comment: bath salts -  crystal meth  4 yrs ago     Allergies   Patient has no known allergies.   Review of Systems Review of Systems  All other systems reviewed and are negative.    Physical Exam Updated Vital Signs BP 131/82 (BP Location: Left Arm)   Pulse 85   Temp 98 F (36.7 C) (Oral)   Resp 18   SpO2 95%   Physical Exam Vitals signs and nursing note reviewed.  Constitutional:      Appearance: Normal appearance.  HENT:     Head: Normocephalic.  Pulmonary:     Effort: Pulmonary effort is normal.  Musculoskeletal:     Comments: Stitches appear clean and intact.  The laceration appears to be healing well with no erythema or purulent drainage.  Neurological:     Mental Status: He is alert.      ED Treatments / Results  Labs (all labs ordered are listed, but only abnormal results are displayed) Labs Reviewed - No data to display  EKG None  Radiology No results found.  Procedures Procedures (including critical care time)  Medications Ordered in ED Medications - No data to display   Initial Impression / Assessment and Plan / ED Course  I have reviewed the triage vital signs and the nursing notes.  Pertinent labs & imaging results that were available during my care of the patient were reviewed by me and considered in my medical decision making (see chart for details).  Sutures removed uneventfully.  Patient to return as needed for any problems.  Final Clinical Impressions(s) / ED  Diagnoses   Final diagnoses:  Visit for suture removal    ED Discharge Orders    None       Geoffery Lyonselo, Ronneisha Jett, MD 01/11/19 [phone]

## 2019-02-11 ENCOUNTER — Other Ambulatory Visit: Payer: Self-pay | Admitting: Psychiatry

## 2019-02-11 DIAGNOSIS — F411 Generalized anxiety disorder: Secondary | ICD-10-CM

## 2019-02-11 DIAGNOSIS — F410 Panic disorder [episodic paroxysmal anxiety] without agoraphobia: Secondary | ICD-10-CM

## 2019-02-11 DIAGNOSIS — F340 Cyclothymic disorder: Secondary | ICD-10-CM

## 2019-02-22 ENCOUNTER — Encounter: Payer: Self-pay | Admitting: Psychiatry

## 2019-02-22 ENCOUNTER — Other Ambulatory Visit: Payer: Self-pay

## 2019-02-22 ENCOUNTER — Ambulatory Visit: Payer: Self-pay | Admitting: Psychiatry

## 2019-02-22 VITALS — Ht 67.0 in | Wt 244.0 lb

## 2019-02-22 DIAGNOSIS — F902 Attention-deficit hyperactivity disorder, combined type: Secondary | ICD-10-CM

## 2019-02-22 DIAGNOSIS — F410 Panic disorder [episodic paroxysmal anxiety] without agoraphobia: Secondary | ICD-10-CM

## 2019-02-22 DIAGNOSIS — F340 Cyclothymic disorder: Secondary | ICD-10-CM

## 2019-02-22 DIAGNOSIS — F88 Other disorders of psychological development: Secondary | ICD-10-CM

## 2019-02-22 DIAGNOSIS — F411 Generalized anxiety disorder: Secondary | ICD-10-CM

## 2019-02-22 MED ORDER — LAMOTRIGINE 150 MG PO TABS: 300.0000 mg | ORAL_TABLET | Freq: Every day | ORAL | 1 refills | Status: DC

## 2019-02-22 MED ORDER — QUETIAPINE FUMARATE 50 MG PO TABS: 50.0000 mg | ORAL_TABLET | Freq: Every day | ORAL | 1 refills | Status: DC

## 2019-02-22 MED ORDER — PAROXETINE HCL 30 MG PO TABS: 30.0000 mg | ORAL_TABLET | Freq: Every day | ORAL | 1 refills | Status: DC
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
## 2019-03-12 ENCOUNTER — Other Ambulatory Visit: Payer: Self-pay | Admitting: Psychiatry

## 2019-03-12 DIAGNOSIS — F410 Panic disorder [episodic paroxysmal anxiety] without agoraphobia: Secondary | ICD-10-CM

## 2019-03-12 DIAGNOSIS — F340 Cyclothymic disorder: Secondary | ICD-10-CM

## 2019-03-12 DIAGNOSIS — F411 Generalized anxiety disorder: Secondary | ICD-10-CM

## 2019-03-13 NOTE — Telephone Encounter (Signed)
Contact pt sent to [facility] on [address], clarify

## 2019-03-14 NOTE — Telephone Encounter (Signed)
Unable to reach pt left msg on cell phone to call back and clarify pharmacy

## 2019-05-02 ENCOUNTER — Other Ambulatory Visit: Payer: Self-pay

## 2019-05-02 ENCOUNTER — Emergency Department (HOSPITAL_COMMUNITY)
Admission: EM | Admit: 2019-05-02 | Discharge: 2019-05-02 | Disposition: A | Discharge status: Home / Self Care | Payer: Self-pay | Attending: Emergency Medicine | Admitting: Emergency Medicine

## 2019-05-02 ENCOUNTER — Emergency Department (HOSPITAL_COMMUNITY): Payer: Self-pay

## 2019-05-02 DIAGNOSIS — Z79899 Other long term (current) drug therapy: Secondary | ICD-10-CM | POA: Insufficient documentation

## 2019-05-02 DIAGNOSIS — R079 Chest pain, unspecified: Secondary | ICD-10-CM | POA: Insufficient documentation

## 2019-05-02 DIAGNOSIS — R42 Dizziness and giddiness: Secondary | ICD-10-CM | POA: Insufficient documentation

## 2019-05-02 DIAGNOSIS — U070 Vaping-related disorder: Secondary | ICD-10-CM | POA: Insufficient documentation

## 2019-05-02 DIAGNOSIS — R4182 Altered mental status, unspecified: Secondary | ICD-10-CM | POA: Insufficient documentation

## 2019-05-02 DIAGNOSIS — Z87891 Personal history of nicotine dependence: Secondary | ICD-10-CM | POA: Insufficient documentation

## 2019-05-02 LAB — CBC
HCT: 47.5 % (ref 39.0–52.0)
Hemoglobin: 15.9 g/dL (ref 13.0–17.0)
MCH: 30.5 pg (ref 26.0–34.0)
MCHC: 33.5 g/dL (ref 30.0–36.0)
MCV: 91 fL (ref 80.0–100.0)
Platelets: 291 10*3/uL (ref 150–400)
RBC: 5.22 MIL/uL (ref 4.22–5.81)
RDW: 11.9 % (ref 11.5–15.5)
WBC: 8.4 10*3/uL (ref 4.0–10.5)
nRBC: 0 % (ref 0.0–0.2)

## 2019-05-02 LAB — BASIC METABOLIC PANEL
Anion gap: 12 (ref 5–15)
BUN: 12 mg/dL (ref 6–20)
CO2: 23 mmol/L (ref 22–32)
Calcium: 9.7 mg/dL (ref 8.9–10.3)
Chloride: 96 mmol/L — ABNORMAL LOW (ref 98–111)
Creatinine, Ser: 1.39 mg/dL — ABNORMAL HIGH (ref 0.61–1.24)
GFR calc Af Amer: >60 mL/min (ref >60)
GFR calc non Af Amer: >60 mL/min (ref >60)
Glucose, Bld: 131 mg/dL — ABNORMAL HIGH (ref 70–99)
Potassium: 3.5 mmol/L (ref 3.5–5.1)
Sodium: 131 mmol/L — ABNORMAL LOW (ref 135–145)

## 2019-05-02 LAB — TROPONIN I (HIGH SENSITIVITY)
Troponin I (High Sensitivity): 4 ng/L (ref <18)
Troponin I (High Sensitivity): 4 ng/L (ref <18)

## 2019-05-02 MED ORDER — SODIUM CHLORIDE 0.9% FLUSH: 3.0000 mL | Freq: Once | INTRAVENOUS | Status: DC

## 2019-05-02 NOTE — Discharge Instructions (Addendum)
Return to the emergency department for worsening chest pain, difficulty breathing, high fever, or other new and concerning symptoms.  Refrain from vaping and other forms of substance abuse.

## 2019-05-02 NOTE — ED Provider Notes (Signed)
[facility] Provider Note   CSN: [id number] Arrival date & time: 05/02/19  1337     History   Chief Complaint Chief Complaint  Patient presents with  . Chest Pain  . Altered Mental Status    HPI Pattricia BossJonathan K Orvis is a 28 y.o. male.     Patient is a 28 year old male with history of anxiety, bipolar disorder, and depression.  He presents today for evaluation of chest pain and lightheadedness.  Patient states that he used his friend's vaping device which he was told contained CBD oil.  Patient states that he immediately began to feel strangely, then started with chest pain and lightheadedness.  He felt as though his chest was "going to cave in".  This lasted for a short period of time, then 911 was called.  Patient was brought here by EMS and symptoms have since resolved.  He tells me he now feels fine.  He denies any chest pain or difficulty breathing at present.  The history is provided by the patient.  Chest Pain Pain location:  Substernal area Pain quality: sharp   Pain radiates to:  Does not radiate Pain severity:  Moderate Onset quality:  Sudden Timing:  Constant Progression:  Resolved Chronicity:  New Relieved by:  Nothing Worsened by:  Nothing Associated symptoms: altered mental status   Altered Mental Status   Past Medical History:  Diagnosis Date  . Anxiety   . Bipolar disorder (HCC)   . Chronic kidney disease    kidney stone  . Depression   . Headache   . Heart murmur 5 years ago   mother states she doesn't know name for heart condition, possible murmer    Patient Active Problem List   Diagnosis Date Noted  . Generalized anxiety disorder 11/22/2018  . 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 CYSTOSCOPY WITH RETROGRADE PYELOGRAM,  URETEROSCOPY AND STENT PLACEMENT Right 09/05/2014   Procedure: CYSTOSCOPY WITH RIGHT RETROGRADE PYELOGRAM AND STENT PLACEMENT;  Surgeon: Kathi LudwigSigmund I Tannenbaum, MD;  Location: [facility];  Service: Urology;  Laterality: Right;  . CYSTOSCOPY WITH RETROGRADE PYELOGRAM, URETEROSCOPY AND STENT PLACEMENT Right 10/21/2014   Procedure: CYSTOSCOPY WITH RIGHT RETROGRADE PYELOGRAM, RIGHT URETEROSCOPY AND  STONE EXTRACTION ,STENT PLACEMENT;  Surgeon: Kathi LudwigSigmund I Tannenbaum, MD;  Location: [facility];  Service: Urology;  Laterality: Right;  . HOLMIUM LASER APPLICATION Right 10/21/2014   Procedure: HOLMIUM LASER ;  Surgeon: Kathi LudwigSigmund I Tannenbaum, MD;  Location: [facility];  Service: Urology;  Laterality: Right;  . undescended testicle     surgery to correct at 28 year old  . WISDOM TOOTH EXTRACTION     all removed at age 28        Home Medications    Prior to Admission medications   Medication Sig Start Date End Date Taking? Authorizing Provider  diazepam (VALIUM) 10 MG tablet Take 10 mg by mouth 2 (two) times daily as needed. 02/01/18   [provider]  hydroquinone 4 % cream Apply 1 application topically at bedtime.    [provider]  lamoTRIgine (LAMICTAL) 150 MG tablet Take 2 tablets (300 mg total) by mouth at bedtime. 02/22/19   Chauncey MannJennings, Glenn E, MD  meloxicam (MOBIC) 15 MG tablet Take 15 mg by mouth daily.    [provider]  Meth-Hyo-M Bl-Na Phos-Ph Sal (URIBEL) 118 MG  CAPS Take 1 capsule (118 mg total) by mouth 3 (three) times daily between meals as needed. 09/05/14   Jethro Bolusannenbaum, Sigmund, MD  ondansetron (ZOFRAN-ODT) 4 MG disintegrating tablet Take 4 mg by mouth every 8 (eight) hours as needed for nausea.     [provider]  oxyCODONE-acetaminophen (ROXICET) 5-325 MG per tablet Take 1 tablet by mouth every 4 (four) hours as needed for severe pain. Patient taking differently: Take 0.5 tablets by mouth every 4 (four) hours as needed for severe pain (Pain).  09/05/14   Jethro Bolusannenbaum,  Sigmund, MD  PARoxetine (PAXIL) 30 MG tablet Take 1 tablet (30 mg total) by mouth at bedtime. 02/22/19   Chauncey MannJennings, Glenn E, MD  phenazopyridine (PYRIDIUM) 200 MG tablet Take 1 tablet (200 mg total) by mouth 3 (three) times daily as needed for pain. 10/21/14   Jethro Bolusannenbaum, Sigmund, MD  QUEtiapine (SEROQUEL) 50 MG tablet Take 1 tablet (50 mg total) by mouth at bedtime. 02/22/19   Chauncey MannJennings, Glenn E, MD  tamsulosin (FLOMAX) 0.4 MG CAPS capsule Take 0.4 mg by mouth daily. 09/19/14   [provider]  tamsulosin (FLOMAX) 0.4 MG CAPS capsule Take 1 capsule (0.4 mg total) by mouth daily. 10/21/14   Jethro Bolusannenbaum, Sigmund, MD  traMADol-acetaminophen (ULTRACET) 37.5-325 MG per tablet Take 1 tablet by mouth every 6 (six) hours as needed. 10/21/14   Jethro Bolusannenbaum, Sigmund, MD  trimethoprim (TRIMPEX) 100 MG tablet Take 1 tablet (100 mg total) by mouth 1 day or 1 dose. 10/21/14   Jethro Bolusannenbaum, Sigmund, MD    Family History No family history on file.  Social History Social History   Tobacco Use  . Smoking status: Former Smoker    Years: 1.00    Types: E-cigarettes    Quit date: 01/12/2019    Years since quitting: 0.3  . Smokeless tobacco: Never Used  Substance Use Topics  . Alcohol use: Yes    Alcohol/week: 2.0 standard drinks    Types: 2 Standard drinks or equivalent per week    Comment: social  . Drug use: Not Currently    Types: Other-see comments    Comment: bath salts -  crystal meth  4 yrs ago     Allergies   Patient has no known allergies.   Review of Systems Review of Systems  Cardiovascular: Positive for chest pain.  All other systems reviewed and are negative.    Physical Exam Updated Vital Signs BP (!) 149/79 (BP Location: Right Arm)   Pulse (!) 119   Temp 98.9 F (37.2 C) (Oral)   Resp 18   SpO2 99%   Physical Exam Vitals signs and nursing note reviewed.  Constitutional:      General: He is not in acute distress.    Appearance: He is well-developed. He is not diaphoretic.   HENT:     Head: Normocephalic and atraumatic.  Neck:     Musculoskeletal: Normal range of motion and neck supple.  Cardiovascular:     Rate and Rhythm: Normal rate and regular rhythm.     Heart sounds: No murmur. No friction rub.  Pulmonary:     Effort: Pulmonary effort is normal. No respiratory distress.     Breath sounds: Normal breath sounds. No wheezing or rales.  Abdominal:     General: Bowel sounds are normal. There is no distension.     Palpations: Abdomen is soft.     Tenderness: There is no abdominal tenderness.  Musculoskeletal: Normal range of motion.     Right lower  leg: He exhibits no tenderness. No edema.     Left lower leg: He exhibits no tenderness. No edema.  Skin:    General: Skin is warm and dry.  Neurological:     Mental Status: He is alert and oriented to person, place, and time.     Coordination: Coordination normal.      ED Treatments / Results  Labs (all labs ordered are listed, but only abnormal results are displayed) Labs Reviewed  BASIC METABOLIC PANEL - Abnormal; Notable for the following components:      Result Value   Sodium 131 (*)    Chloride 96 (*)    Glucose, Bld 131 (*)    Creatinine, Ser 1.39 (*)    All other components within normal limits  CBC  TROPONIN I (HIGH SENSITIVITY)  TROPONIN I (HIGH SENSITIVITY)    EKG EKG Interpretation  Date/Time:  Wednesday May 02 2019 13:42:04 EDT Ventricular Rate:  112 PR Interval:  178 QRS Duration: 98 QT Interval:  306 QTC Calculation: 417 R Axis:   84 Text Interpretation:  Sinus tachycardia Incomplete right bundle branch block Abnormal ECG Confirmed by Veryl Speak [phone]) on 05/02/2019 4:24:17 PM   Radiology Dg Chest 2 View  Result Date: 05/02/2019 CLINICAL DATA:  Chest pain EXAM: CHEST - 2 VIEW COMPARISON:  Jan 12, 2018 FINDINGS: Lungs are clear. Heart size and pulmonary vascularity are normal. No adenopathy. No pneumothorax. No bone lesions. IMPRESSION: No edema or consolidation.  Electronically Signed   By: Lowella Grip III M.D.   On: 05/02/2019 14:14    Procedures Procedures (including critical care time)  Medications Ordered in ED Medications  sodium chloride flush (NS) 0.9 % injection 3 mL (has no administration in time range)     Initial Impression / Assessment and Plan / ED Course  I have reviewed the triage vital signs and the nursing notes.  Pertinent labs & imaging results that were available during my care of the patient were reviewed by me and considered in my medical decision making (see chart for details).  Patient presenting here with complaints of chest pain that began after using his friend's vaping machine that he was told contained CBD oil.  Patient observed here for several hours and has had 2- troponins.  His EKG is unchanged.  Patient seems appropriate for discharge.  To return as needed for any problems.  Final Clinical Impressions(s) / ED Diagnoses   Final diagnoses:  None    ED Discharge Orders    None       Veryl Speak, MD 05/02/19 1743

## 2019-05-02 NOTE — ED Notes (Signed)
Patient verbalizes understanding of discharge instructions. Opportunity for questioning and answers were provided. Armband removed by staff, pt discharged from ED.  

## 2019-05-02 NOTE — ED Notes (Signed)
Pt dad would like a call when pt is moved to a room. Tidus Upchurch [phone]

## 2019-05-02 NOTE — ED Triage Notes (Signed)
Pt here for chest pain and altered mental status after marijuana use at 1230 today. Pt can answer orientation questions appropriately except thinks he is in [place]. Sometimes denies chest pain, other times endorses it. On arrival, pt just sts he feels numb.

## 2019-05-03 ENCOUNTER — Telehealth: Payer: Self-pay | Admitting: Psychiatry

## 2019-05-03 NOTE — Telephone Encounter (Signed)
Pt mom Hilda Blades called for advise for testing for Savir. Ask you to return call. [phone]

## 2019-05-03 NOTE — Telephone Encounter (Signed)
Adoptive mother phones that she is still close to retirement and patient is not showing consistency of employment to become self-sufficient in any way.  Mother has looked into disability for him as well as other resources for determining job readiness or match.  She recalls that in high school the therapist working with his [employer] wished she had more time over the years to have provided compensatory training to him.  Mother recalls that he was tested 1 time and found to have an IQ around 16 not with modifications apparently to validate the testing for his auditory processing problems.  Mother is asking for any direction that might help advise as to testing, rehab, validation of disability, best delineation according to lack of ICD 10 commonly accepted diagnoses, etc.:  Audiology Alma Friendly, MA to be her best point of initial contact for [facility].

## 2019-08-23 ENCOUNTER — Ambulatory Visit: Payer: Self-pay | Admitting: Psychiatry

## 2019-09-09 ENCOUNTER — Other Ambulatory Visit: Payer: Self-pay | Admitting: Psychiatry

## 2019-09-09 DIAGNOSIS — F411 Generalized anxiety disorder: Secondary | ICD-10-CM

## 2019-09-09 DIAGNOSIS — F340 Cyclothymic disorder: Secondary | ICD-10-CM

## 2019-09-10 NOTE — Telephone Encounter (Signed)
With job loss and adoptive parent retirement, they allow only yearly appointment sending 90-day supply of Paxil 30 mg nightly as they request with no refill to [facility]

## 2019-09-10 NOTE — Telephone Encounter (Signed)
Due back now for 6 month f/u not schedule yet. 90 day okay?

## 2019-09-11 ENCOUNTER — Other Ambulatory Visit: Payer: Self-pay | Admitting: Psychiatry

## 2019-09-11 DIAGNOSIS — F340 Cyclothymic disorder: Secondary | ICD-10-CM

## 2019-09-11 DIAGNOSIS — F410 Panic disorder [episodic paroxysmal anxiety] without agoraphobia: Secondary | ICD-10-CM

## 2019-09-11 DIAGNOSIS — F411 Generalized anxiety disorder: Secondary | ICD-10-CM

## 2019-09-20 ENCOUNTER — Other Ambulatory Visit: Payer: Self-pay

## 2019-09-20 ENCOUNTER — Encounter: Payer: Self-pay | Admitting: Psychiatry

## 2019-09-20 ENCOUNTER — Ambulatory Visit (INDEPENDENT_AMBULATORY_CARE_PROVIDER_SITE_OTHER): Payer: Self-pay | Admitting: Psychiatry

## 2019-09-20 VITALS — Ht 67.0 in | Wt 256.0 lb

## 2019-09-20 DIAGNOSIS — F902 Attention-deficit hyperactivity disorder, combined type: Secondary | ICD-10-CM

## 2019-09-20 DIAGNOSIS — F8082 Social pragmatic communication disorder: Secondary | ICD-10-CM

## 2019-09-20 DIAGNOSIS — F88 Other disorders of psychological development: Secondary | ICD-10-CM

## 2019-09-20 DIAGNOSIS — F82 Specific developmental disorder of motor function: Secondary | ICD-10-CM | POA: Insufficient documentation

## 2019-09-20 DIAGNOSIS — F411 Generalized anxiety disorder: Secondary | ICD-10-CM

## 2019-09-20 DIAGNOSIS — F410 Panic disorder [episodic paroxysmal anxiety] without agoraphobia: Secondary | ICD-10-CM

## 2019-09-20 DIAGNOSIS — F340 Cyclothymic disorder: Secondary | ICD-10-CM

## 2019-09-20 MED ORDER — PAROXETINE HCL 30 MG PO TABS: 30.0000 mg | ORAL_TABLET | Freq: Every day | ORAL | 1 refills | Status: DC

## 2019-09-20 MED ORDER — QUETIAPINE FUMARATE 50 MG PO TABS: 50.0000 mg | ORAL_TABLET | Freq: Every day | ORAL | 2 refills | Status: DC

## 2019-09-20 MED ORDER — LAMOTRIGINE 150 MG PO TABS: 300.0000 mg | ORAL_TABLET | Freq: Every day | ORAL | 2 refills | Status: DC

## 2019-09-20 MED ORDER — DIAZEPAM 10 MG PO TABS: 10.0000 mg | ORAL_TABLET | Freq: Every day | ORAL | 1 refills | Status: DC

## 2019-09-20 NOTE — Progress Notes (Signed)
[facility]  Patient ID: Tony Hayes Hayes,  MRN: [id number]  PCP: Patient, No Pcp Per  Date of Evaluation: 09/20/2019 Time spent:20 minutes from 1145 to 1205  Chief Complaint:  Chief Complaint    Panic Attack; Anxiety; Depression; Manic Behavior; Agitation; ADHD      HISTORY/CURRENT STATUS: Tony Hayes Hayes is seen onsite in office face-to-face 20 minutes individually after adoptive father is seen individually 5 minutes with consent with epic collateral for psychiatric interview and exam in 89-monthevaluation and management of anxiety, mood swings, ADHD, and learning and developmental variances that are multifactorial.  Adoptive father notes that he is retired nearly 89years of age not clarifying whether 28year old adoptive mother has yet retired from pTherapist, sportswork telling me again they do not have the economics to continue to provide for patient now or in the future.  The father states that the disability attorney told him that the doctor said that Tony Hayes Ishiharacould work and had no reason not to work therefore father expects me to determine from [place] why he is not successful at work. I have not talked to any disability lawyer or physician only requested records sent in copy to disability determination services 03/22/2019..Marland KitchenAdoptive mother had phoned in August about high school counselor wishing there was more time to help Tony Hayes Ishiharawith his problems of not understanding what other people mean such that I suggested she might mean auditory processing disorder providing reference for audiology assessment. 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  Father states patient was fired on Christmas Eve for being absent 3 days from work while Tony Hayes Ishiharastates he was fired because he failed the 90-day conditional appointment not adequately learning the required job skills expected.  Tony Hayes Ishiharastates that this was a job at the same place where his fianc still works.  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  Father considers that Tony Hayes's medications are adequate, and neither Tony Hayes Ishiharanor her father are concerned for any rage today.  He has gained 12 pounds on the Seroquel in 6 months but the combination of Seroquel and Lamictal seems to be controlling his mood swings and rage somewhat while his anxiety is the most substantial interference with activities of daily life by [facility].  Mother and father consider Tony Hayes Ishiharahas always tested at 726on IQ in the past and they consider that disabled, though none of such testing was done through this office, though possibly through his [facility] D[place] Domprovided [facility] sending u[place]aa copy of GRPA testing by BCharolotte Eke MA with verbal IQ of 97 and performance of 95 with full-scale 95 though DGeorgia Domnoted ADHD T scores were above 70 cut off.  Tony Hayes Hayes  is anxious here about work and family concerns nearly in tears as he quietly sitting self absorbed during the discussion offering limited other detail.  He does not acknowledge bath salts, alcohol, or other substance use currently and denies continuing anabolic steroids for his previous bodybuilding.  [name]  registry documents last Valium dispensing 10/24/2018 patient stating he still takes 10 mg every night also helping sleep and panic though [place] registry does not record dispensiing since that of #180 tablets in February.  He is not manic, psychotic, delirious, intoxicated, or suicidal today.  Anxiety  Presents forfollow-upvisit. Symptoms includechest pain,decreased concentration,dry mouth,feeling of choking,hyperventilation,insomnia,irritability,muscle tension,nausea,palpitations,panic, confusion,restlessnessand shortness of breath. Patient reports nodepressed mood, misperceptions, compulsions,or suicidal ideas. Symptoms occurevery day or two. The most recent episode lasted30 minutes. The severity of symptoms isinterfering with daily activities, causing significant distress and incapacitating. The patient sleeps4 hoursper night. The quality of sleep ispoor. Nighttime awakenings:several. Compliance with medications is76-100%. Side effects of treatment includeGI discomfort.  Individual Medical History/ Review of Systems: Changes? :Yes Weight is up 12 pounds from 6 months ago continuing Seroquel though low-dose.  ED on 05/02/2023 chest pain and dyspnea associated with vaping friend's CBD oil symptoms resolving in the ED  Allergies: Patient has no known allergies.  Current Medications:  Current Outpatient Medications:  .  diazepam (VALIUM) 10 MG tablet, Take 1 tablet (10 mg total) by mouth at bedtime., Disp: 90 tablet, Rfl: 1 .  lamoTRIgine (LAMICTAL) 150 MG tablet, Take 2 tablets (300 mg total) by mouth at bedtime., Disp: 180 tablet, Rfl: 2 .  meloxicam (MOBIC) 15 MG tablet, Take 15 mg by mouth daily., Disp: , Rfl:  .  ondansetron (ZOFRAN-ODT) 4 MG disintegrating tablet, Take 4 mg by mouth every 8 (eight) hours as needed for nausea. , Disp: , Rfl:  .  Tony Hayes Hayes (PAXIL) 30 MG tablet, Take 1 tablet (30 mg total) by mouth at bedtime., Disp: 90 tablet, Rfl: 1 .  phenazopyridine  (PYRIDIUM) 200 MG tablet, Take 1 tablet (200 mg total) by mouth 3 (three) times daily as needed for pain., Disp: 30 tablet, Rfl: 3 .  QUEtiapine (SEROQUEL) 50 MG tablet, Take 1 tablet (50 mg total) by mouth at bedtime., Disp: 90 tablet, Rfl: 2 .  tamsulosin (FLOMAX) 0.4 MG CAPS capsule, Take 0.4 mg by mouth daily., Disp: , Rfl: 0 .  traMADol-acetaminophen (ULTRACET) 37.5-325 MG per tablet, Take 1 tablet by mouth every 6 (six) hours as needed. (Patient taking differently: Take 1 tablet by mouth every 6 (six) hours as needed for moderate pain. ), Disp: 30 tablet, Rfl: 2 .  trimethoprim (TRIMPEX) 100 MG tablet, Take 1 tablet (100 mg total) by mouth 1 day or 1 dose., Disp: 30 tablet, Rfl: 1  Medication Side Effects: weight gain  Family Medical/ Social History: Changes? Yes patient having a fianc now having much less anger but more anxiety as adoptive parents are retiring in patient cannot get into the gym or other activities  MENTAL HEALTH EXAM:  Height '5\' 7"'$  (1.702 m), weight 256 lb (116.1 kg).Body mass index is 40.1 kg/m. Muscle strengths and tone 5/5, postural reflexes and gait 0/0, and AIMS = 0 otherwise deferred for coronavirus shutdown  General Appearance: Casual, Fairly Groomed, Guarded, Meticulous and Obese  Eye Contact:  Fair  Speech:  Blocked, Clear and Coherent, Normal Rate, Slow and Talkative  Volume:  Normal  Mood:  Anxious, Dysphoric, Irritable and Worthless  Affect:  Congruent, Constricted, Full Range and Anxious  Thought Process:  Coherent, Irrelevant, Linear  and Descriptions of Associations: Circumstantial  Orientation:  Full (Time, Place, and Person)  Thought Content: Logical, Ilusions, Obsessions and Rumination   Suicidal Thoughts:  No  Homicidal Thoughts:  No  Memory:  Immediate;   Fair Remote;   Good  Judgement:  Impaired  Insight:  Lacking  Psychomotor Activity:  Decreased, Mannerisms and Restlessness  Concentration:  Concentration: Poor and Attention Span: Fair to  poor  Recall:  Poor  Fund of Knowledge: Fair  Language: Fair to poor  Assets:  Housing Leisure Time Transportation  ADL's:  Intact  Cognition: WNL  Prognosis:  Poor    DIAGNOSES:    ICD-10-CM   1. Generalized anxiety disorder  F41.1 QUEtiapine (SEROQUEL) 50 MG tablet    Tony Hayes Hayes (PAXIL) 30 MG tablet    diazepam (VALIUM) 10 MG tablet  2. 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 Developmental coordination disorder  F82     Receiving Psychotherapy: No No longer seeing Lanetta Inch, [facility] patient states due to economics given number in reference for mental health [place] support groups, [facility] for disability rehab, and [facility] for mental health and Medicaid   RECOMMENDATIONS: Psychosupportive psychoeducation addresses cumulative load of multifocal symptoms with which there are no islands of competence or recovery resources.  Further assessment for such might best be clarified at the [place] to which he is given number in reference or [facility] where competence and capacity to stand trial terminations are provided.  As patient has received treatment from last at least since 01/09/2010 if not earlier after they worked with Dr. Candis Schatz who diagnosed bipolar disorder, they may all benefit from reassessment and transfer of care to an adult psychiatrist.  Mental health support groups are available free of charge at  [facility] in which patient and possibly fianc should participate.  I did renew his medications using the Valium only at bedtime sent as 10 mg every bedtime #90 with 1 refill to [facility] on [address] for panic and generalized anxiety, cyclothymia, and secondary neurodevelopmental disorder..  We have just sent in a refill of 90 days for Paxil therefore E scribed to the pharmacy and additional Paxil 30 mg every bedtime #90 with 1 refill to [facility] for generalized and panic anxiety shoulders and ADHD being unable to tolerate any other treatment for such.  He is E scribed Lamictal 150 mg tablet taking 2 tablets total 300 mg every bedtime sent as a 90-day supply and 2 refills to [facility] for cyclothymia and secondary neurodevelopmental disorder.  He is E scribed Seroquel 50 mg every bedtime sent as #90 with 2 refills to [place] [facility] for cyclothymia, panic and generalized anxiety, and secondary neurodevelopmental disorder.  I otherwise return in 6 months for follow-up or sooner if needed.   Delight Hoh, MD

## 2020-01-28 ENCOUNTER — Telehealth: Payer: Self-pay | Admitting: Psychiatry

## 2020-01-28 NOTE — Telephone Encounter (Signed)
Mom notified she can pick those up

## 2020-01-28 NOTE — Telephone Encounter (Signed)
According to Rx's he should already have refills on file. Will contact [facility] to confirm they are available for him.

## 2020-01-28 NOTE — Telephone Encounter (Signed)
Spoke with pharmacist and they filled them on 01/22/2020, waiting for him to pick up.

## 2020-01-28 NOTE — Telephone Encounter (Signed)
Patient called and said that he needs refills on his lamictal and paxil sent to [facility]. He is out as of today

## 2020-03-19 ENCOUNTER — Encounter: Payer: Self-pay | Admitting: Psychiatry

## 2020-03-19 ENCOUNTER — Other Ambulatory Visit: Payer: Self-pay

## 2020-03-19 ENCOUNTER — Encounter (INDEPENDENT_AMBULATORY_CARE_PROVIDER_SITE_OTHER): Payer: Self-pay

## 2020-03-19 ENCOUNTER — Ambulatory Visit (INDEPENDENT_AMBULATORY_CARE_PROVIDER_SITE_OTHER): Payer: Self-pay | Admitting: Psychiatry

## 2020-03-19 VITALS — Ht 67.0 in | Wt 238.0 lb

## 2020-03-19 DIAGNOSIS — F411 Generalized anxiety disorder: Secondary | ICD-10-CM

## 2020-03-19 DIAGNOSIS — F340 Cyclothymic disorder: Secondary | ICD-10-CM

## 2020-03-19 DIAGNOSIS — F902 Attention-deficit hyperactivity disorder, combined type: Secondary | ICD-10-CM

## 2020-03-19 DIAGNOSIS — F88 Other disorders of psychological development: Secondary | ICD-10-CM

## 2020-03-19 DIAGNOSIS — F410 Panic disorder [episodic paroxysmal anxiety] without agoraphobia: Secondary | ICD-10-CM

## 2020-03-19 MED ORDER — PAROXETINE HCL 30 MG PO TABS: 30.0000 mg | ORAL_TABLET | Freq: Every day | ORAL | 1 refills | Status: DC

## 2020-03-19 MED ORDER — QUETIAPINE FUMARATE 50 MG PO TABS: 50.0000 mg | ORAL_TABLET | Freq: Every day | ORAL | 1 refills | Status: DC

## 2020-03-19 MED ORDER — LAMOTRIGINE 150 MG PO TABS: 300.0000 mg | ORAL_TABLET | Freq: Every day | ORAL | 1 refills | Status: DC

## 2020-03-19 NOTE — Progress Notes (Signed)
[facility]  Patient ID: Tony Hayes,  MRN: [id number]  PCP: Patient, No Pcp Per  Date of Evaluation: 03/19/2020 Time spent:25 minutes from 1140 to 1205  Chief Complaint:  Chief Complaint    Anxiety; Depression; Manic Behavior; ADHD      HISTORY/CURRENT STATUS: Tony Hayes is seen onsite in office 25 minutes face-to-face individually with adoptive mother in the lobby only participating if  medicines are changed with consent with epic collateral for psychiatric interview and exam in 39-month evaluation and management of generalized anxiety and panic, cyclothymia, ADHD, and secondary neurodevelopmental disorder associated with prenatal cocaine.  His developmental coordination disorder has resolved as would be evident in his jujitsu. He is no longer using steroids or testosterone.  He is considered by the adoptive family to have a more pervasive developmental disorder in losing jobs and relationships in adult life, so that social communication disorder was considered last appointment first time but not sustained now.  In fact he is much improved.  His current girlfriend worried the family but all is more appropriate including in address so that patient maintains their relationship with no definite time for marriage but serious support for each other.  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  He suggests he may not yet have benefits but will in the future.  He has not filled or needed further Valium since last appointment January 7 for 90 tablets required at bedtime when he was rather decompensating.  He is taking Seroquel 50 mg at bedtime which greatly helps sleep and cognitive and affective organization, and he continues the Paxil 30 mg and Lamictal 300 mg at bedtime.  He presents requesting medications be continued with out the Valium as off it for at least several months.  He uses no alcohol or illicit  drugs other than CBD which relaxes his anxiety.  He has no mania, suicidality, psychosis or delirium.  Anxiety             Presents forfollow-upvisit. Symptoms includeepisodic reactive sadness and worry, chest pain,decreased concentration,palpitations,prepanic, confusion,restlessnessand shortness of breath. Patient reports nodepressed mood, misperceptions, compulsions, suicidal ideas, dry mouth, choking sensation, hyperventilation,insomnia,irritability,muscle tension,or nausea,. Symptoms occurevery day or two. The most recent episode lasted20 minutes. The severity of symptoms ismoderate not interfering with daily activities. The patient sleeps8 hoursper night. The quality of sleep isgood. Nighttime awakenings:several. Compliance with medications is76-100%. Side effects of treatment includeGI discomfort.  Individual Medical History/ Review of Systems: Changes? :Yes , weight is down 18 pounds in 6 months stay in shape.  Allergies: Patient has no known allergies.  Current Medications:  Current Outpatient Medications:  .  lamoTRIgine (LAMICTAL) 150 MG tablet, Take 2 tablets (300 mg total) by mouth at bedtime., Disp: 180 tablet, Rfl: 1 .  meloxicam (MOBIC) 15 MG tablet, Take 15 mg by mouth daily., Disp: , Rfl:  .  ondansetron (ZOFRAN-ODT) 4 MG disintegrating tablet, Take 4 mg by mouth every 8 (eight) hours as needed for nausea. , Disp: , Rfl:  .  PARoxetine (PAXIL) 30 MG tablet, Take 1 tablet (30 mg total) by mouth at bedtime., Disp: 90 tablet, Rfl: 1 .  phenazopyridine (PYRIDIUM) 200 MG tablet, Take 1 tablet (200 mg total) by mouth 3 (three) times daily as needed for pain., Disp: 30 tablet, Rfl: 3 .  QUEtiapine (SEROQUEL) 50 MG tablet, Take 1 tablet (50 mg total) by mouth at bedtime., Disp: 90 tablet,  Rfl: 1 .  tamsulosin (FLOMAX) 0.4 MG CAPS capsule, Take 0.4 mg by mouth daily., Disp: , Rfl: 0 .  traMADol-acetaminophen (ULTRACET) 37.5-325 MG per tablet, Take 1 tablet by mouth  every 6 (six) hours as needed. (Patient taking differently: Take 1 tablet by mouth every 6 (six) hours as needed for moderate pain. ), Disp: 30 tablet, Rfl: 2 .  trimethoprim (TRIMPEX) 100 MG tablet, Take 1 tablet (100 mg total) by mouth 1 day or 1 dose., Disp: 30 tablet, Rfl: 1  Medication Side Effects: none  Family Medical/ Social History: Changes? No  MENTAL HEALTH EXAM:  Height 5\' 7"  (1.702 m), weight 238 lb (108 kg).Body mass index is 37.28 kg/m. Muscle strengths and tone 5/5, postural reflexes and gait 0/0, and AIMS = 0.  General Appearance: Casual, Meticulous, Well Groomed and Obese  Eye Contact:  Good  Speech:  Clear and Coherent, Normal Rate and Talkative  Volume:  Normal  Mood:  Anxious, Dysphoric and Euthymic  Affect:  Congruent and Full Range  Thought Process:  Coherent, Goal Directed, Irrelevant, Linear and Descriptions of Associations: Circumstantial  Orientation:  Full (Time, Place, and Person)  Thought Content: Obsessions and Rumination   Suicidal Thoughts:  No  Homicidal Thoughts:  No  Memory:  Immediate;   Good and Fair Remote;   Fair to Good  Judgement:  Fair  Insight:  Fair  Psychomotor Activity:  Normal and Mannerisms and Restlessness  Concentration:  Concentration: Fair and Attention Span: Fair  Recall:  of Knowledge: Good  Language: Fair  Assets:  Desire for Improvement Leisure Time Resilience Talents/Skills  ADL's:  Intact  Cognition: WNL  Prognosis:  Fair    DIAGNOSES:    ICD-10-CM   1. 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 Panic disorder  F41.0 QUEtiapine (SEROQUEL) 50 MG tablet    PARoxetine (PAXIL) 30 MG tablet  4. Attention deficit hyperactivity disorder (ADHD), combined type, mild  F90.2 PARoxetine (PAXIL) 30 MG tablet  5. Secondary neurodevelopmental  disorder  F88 QUEtiapine (SEROQUEL) 50 MG tablet    lamoTRIgine (LAMICTAL) 150 MG tablet    Receiving Psychotherapy: Yes , patient expects to resume with [employer], [facility] when the receives benefits on the job otherwise he has self-help online EMDR   RECOMMENDATIONS: Psycho supportive psychoeducation updates progress in all areas of his life for consolidation and reinforcement.  Adoptive parents do not participate when the patient is functioning and feeling better though adoptive mother is present as she must discuss any change in medications.  We discontinue Valium about which family will be pleased.  Patient is E scribed to continue Paxil 30 mg every bedtime #90 with 1 refill sent to [facility] pharmacy for anxiety disorders, ADHD, and cyclothymia.  He is E scribed Lamictal 150 mg taking 2 tablets total 300 mg every bedtime as #180 with 1 refill sent to [facility] pharmacy for cyclothymia.  He is E scribed Seroquel 50 mg every bedtime sent as #90 with 1 refill to [facility] pharmacy for panic disorder and cyclothymia.  He returns for follow-up in 6 months or sooner if needed.   [facility], MD

## 2020-05-16 ENCOUNTER — Other Ambulatory Visit: Payer: Self-pay

## 2020-07-02 ENCOUNTER — Encounter: Payer: Self-pay | Admitting: Psychiatry

## 2020-08-21 ENCOUNTER — Emergency Department (HOSPITAL_COMMUNITY): Payer: HRSA Program

## 2020-08-21 ENCOUNTER — Other Ambulatory Visit: Payer: Self-pay

## 2020-08-21 ENCOUNTER — Encounter (HOSPITAL_COMMUNITY): Payer: Self-pay

## 2020-08-21 ENCOUNTER — Emergency Department (HOSPITAL_COMMUNITY)
Admission: EM | Admit: 2020-08-21 | Discharge: 2020-08-21 | Disposition: A | Discharge status: Home / Self Care | Payer: HRSA Program | Attending: Emergency Medicine | Admitting: Emergency Medicine

## 2020-08-21 DIAGNOSIS — U071 COVID-19: Secondary | ICD-10-CM | POA: Insufficient documentation

## 2020-08-21 DIAGNOSIS — Z87891 Personal history of nicotine dependence: Secondary | ICD-10-CM | POA: Diagnosis not present

## 2020-08-21 DIAGNOSIS — N189 Chronic kidney disease, unspecified: Secondary | ICD-10-CM | POA: Insufficient documentation

## 2020-08-21 DIAGNOSIS — Z955 Presence of coronary angioplasty implant and graft: Secondary | ICD-10-CM | POA: Diagnosis not present

## 2020-08-21 DIAGNOSIS — R509 Fever, unspecified: Secondary | ICD-10-CM | POA: Diagnosis present

## 2020-08-21 MED ORDER — ACETAMINOPHEN 325 MG PO TABS
650.0000 mg | ORAL_TABLET | Freq: Once | ORAL | Status: AC
  Administered 2020-08-21: 650 mg via ORAL

## 2020-08-21 MED ORDER — IBUPROFEN 600 MG PO TABS: 600.0000 mg | ORAL_TABLET | Freq: Four times a day (QID) | ORAL | 0 refills | Status: DC | PRN

## 2020-08-21 MED ORDER — ACETAMINOPHEN 325 MG PO TABS: 650.0000 mg | ORAL_TABLET | Freq: Four times a day (QID) | ORAL | 0 refills | Status: DC | PRN

## 2020-08-21 MED ORDER — ACETAMINOPHEN 325 MG PO TABS
650.0000 mg | ORAL_TABLET | Freq: Once | ORAL | Status: DC | PRN
  Filled 2020-08-21: qty 2

## 2020-08-21 MED ORDER — ALUMINUM HYDROXIDE GEL 320 MG/5ML PO SUSP: 10.0000 mL | Freq: Four times a day (QID) | ORAL | 0 refills | Status: AC | PRN

## 2020-08-21 MED ORDER — ALUM & MAG HYDROXIDE-SIMETH 200-200-20 MG/5ML PO SUSP
30.0000 mL | Freq: Once | ORAL | Status: AC
  Administered 2020-08-21: 30 mL via ORAL
  Filled 2020-08-21: qty 30

## 2020-08-21 NOTE — ED Notes (Signed)
An After Visit Summary was printed and given to the patient. Discharge instructions given and no further questions at this time.  

## 2020-08-21 NOTE — ED Provider Notes (Addendum)
[facility]-EMERGENCY DEPT Provider Note   CSN: [id number] Arrival date & time: 08/21/20  1900     History Chief Complaint  Patient presents with  . Generalized Body Aches    Tony Hayes is a 29 y.o. male.  HPI   29 year old male with past medical history of bipolar disorder, depression, anxiety presents the emergency department with concern for fever/chills after being diagnosed Covid positive.  Patient states he has had symptoms for almost a week now, he tested +3 days ago.  He was infected by his sister who was also Covid positive.  Patient states over the last couple days has been experiencing fever, chills and decreased appetite with nausea.  He is not taking any medication for the symptoms.  Patient mention in triage that he has a burn to his back after taking a very hot shower because he was so cold, patient tells me that this has since healed.  He denies any chest pain, difficulty breathing, diarrhea, swelling of his lower extremities.  No headache or neuro symptoms.  Past Medical History:  Diagnosis Date  . Anxiety   . Bipolar disorder (HCC)   . Chronic kidney disease    kidney stone  . Depression   . Headache   . Heart murmur 5 years ago   mother states she doesn't know name for heart condition, possible murmer    Patient Active Problem List   Diagnosis Date Noted  . Panic disorder 09/20/2019  . 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 Generalized anxiety disorder 11/22/2018  . 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 CYSTOSCOPY WITH RETROGRADE PYELOGRAM, URETEROSCOPY AND STENT PLACEMENT Right 09/05/2014   Procedure: CYSTOSCOPY WITH RIGHT RETROGRADE PYELOGRAM AND STENT PLACEMENT;  Surgeon:  Kathi Ludwig, MD;  Location: [facility];  Service: Urology;  Laterality: Right;  . CYSTOSCOPY WITH RETROGRADE PYELOGRAM, URETEROSCOPY AND STENT PLACEMENT Right 10/21/2014   Procedure: CYSTOSCOPY WITH RIGHT RETROGRADE PYELOGRAM, RIGHT URETEROSCOPY AND  STONE EXTRACTION ,STENT PLACEMENT;  Surgeon: Kathi Ludwig, MD;  Location: [facility];  Service: Urology;  Laterality: Right;  . HOLMIUM LASER APPLICATION Right 10/21/2014   Procedure: HOLMIUM LASER ;  Surgeon: Kathi Ludwig, MD;  Location: [facility];  Service: Urology;  Laterality: Right;  . undescended testicle     surgery to correct at 30 year old  . WISDOM TOOTH EXTRACTION     all removed at age 69       No family history on file.  Social History   Tobacco Use  . Smoking status: Former Smoker    Years: 1.00    Types: E-cigarettes    Quit date: 01/12/2019    Years since quitting: 1.6  . Smokeless tobacco: Never Used  Vaping Use  . Vaping Use: Former  . Quit date: 01/12/2019  Substance Use Topics  . Alcohol use: Not Currently    Alcohol/week: 2.0 standard drinks    Types: 2 Standard drinks or equivalent per week    Comment: social  . Drug use: Not Currently    Types: Other-see comments    Comment: bath salts -  crystal meth  4 yrs ago    Home Medications Prior to Admission medications   Medication Sig Start Date End Date Taking? Authorizing Provider  acetaminophen (TYLENOL) 325 MG tablet Take 2  tablets (650 mg total) by mouth every 6 (six) hours as needed. 08/21/20   Shastina Rua, Clabe Seal, DO  aluminum hydroxide (AMPHOJEL/ALTERNAGEL) 320 MG/5ML suspension Take 10 mLs by mouth every 6 (six) hours as needed for up to 7 days for indigestion or heartburn. 08/21/20 08/28/20  Kaeleigh Westendorf, Clabe Seal, DO  ibuprofen (ADVIL) 600 MG tablet Take 1 tablet (600 mg total) by mouth every 6 (six) hours as needed for up to 30 doses. 08/21/20   Deziah Renwick, Clabe Seal, DO  lamoTRIgine (LAMICTAL) 150 MG tablet Take 2 tablets (300 mg total) by mouth at bedtime.  03/19/20   Chauncey Mann, MD  ondansetron (ZOFRAN-ODT) 4 MG disintegrating tablet Take 4 mg by mouth every 8 (eight) hours as needed for nausea.     [provider]  PARoxetine (PAXIL) 30 MG tablet Take 1 tablet (30 mg total) by mouth at bedtime. 03/19/20   Chauncey Mann, MD  phenazopyridine (PYRIDIUM) 200 MG tablet Take 1 tablet (200 mg total) by mouth 3 (three) times daily as needed for pain. 10/21/14   Jethro Bolus, MD  QUEtiapine (SEROQUEL) 50 MG tablet Take 1 tablet (50 mg total) by mouth at bedtime. 03/19/20   Chauncey Mann, MD  tamsulosin (FLOMAX) 0.4 MG CAPS capsule Take 0.4 mg by mouth daily. 09/19/14   [provider]  trimethoprim (TRIMPEX) 100 MG tablet Take 1 tablet (100 mg total) by mouth 1 day or 1 dose. 10/21/14   Jethro Bolus, MD    Allergies    Patient has no known allergies.  Review of Systems   Review of Systems  Constitutional: Positive for chills and fever. Negative for diaphoresis.  HENT: Negative for congestion.   Eyes: Negative for visual disturbance.  Respiratory: Positive for wheezing. Negative for cough, chest tightness and shortness of breath.   Cardiovascular: Negative for chest pain and leg swelling.  Gastrointestinal: Positive for nausea. Negative for abdominal pain, diarrhea and vomiting.  Genitourinary: Negative for dysuria.  Musculoskeletal: Positive for arthralgias and myalgias.  Skin: Negative for rash.  Neurological: Negative for headaches.    Physical Exam Updated Vital Signs BP (!) 141/88 (BP Location: Right Arm)   Pulse 96   Temp (!) 100.4 F (38 C) (Oral)   Resp 18   Ht 5\' 7"  (1.702 m)   Wt 108 kg   SpO2 96%   BMI 37.29 kg/m   Physical Exam Vitals and nursing note reviewed.  Constitutional:      General: He is not in acute distress.    Appearance: Normal appearance. He is not ill-appearing, toxic-appearing or diaphoretic.  HENT:     Head: Normocephalic.     Mouth/Throat:     Mouth: Mucous  membranes are moist.  Cardiovascular:     Rate and Rhythm: Normal rate.  Pulmonary:     Effort: Pulmonary effort is normal. No respiratory distress.     Breath sounds: No wheezing.  Abdominal:     Palpations: Abdomen is soft.     Tenderness: There is no abdominal tenderness.  Musculoskeletal:        General: No swelling.     Cervical back: No rigidity.  Skin:    General: Skin is warm.     Comments: Patient points to his mid upper back as to where this "burn" was, the skin is normal, there is no redness or abnormality, no tenderness with exam/touch.  Neurological:     Mental Status: He is alert and oriented to person, place, and time. Mental  status is at baseline.  Psychiatric:     Comments: Anxious but cooperative     ED Results / Procedures / Treatments   Labs (all labs ordered are listed, but only abnormal results are displayed) Labs Reviewed - No data to display  EKG None  Radiology DG Chest 2 View  Result Date: 08/21/2020 CLINICAL DATA:  Shortness of breath.  COVID positive. EXAM: CHEST - 2 VIEW COMPARISON:  05/02/2019 FINDINGS: There are hazy bilateral airspace opacities, greatest within the right mid and right upper lung zones. There is no pneumothorax or large pleural effusion. The heart size is unremarkable. There is no definite acute osseous abnormality. IMPRESSION: Hazy bilateral airspace opacities, greatest within the right upper lung zones, consistent with the patient's known viral pneumonia. Electronically Signed   By: Katherine Mantle M.D.   On: 08/21/2020 20:03    Procedures Procedures (including critical care time)  Medications Ordered in ED Medications  acetaminophen (TYLENOL) tablet 650 mg (has no administration in time range)  acetaminophen (TYLENOL) tablet 650 mg (has no administration in time range)  alum & mag hydroxide-simeth (MAALOX/MYLANTA) 200-200-20 MG/5ML suspension 30 mL (has no administration in time range)    ED Course  I have reviewed  the triage vital signs and the nursing notes.  Pertinent labs & imaging results that were available during my care of the patient were reviewed by me and considered in my medical decision making (see chart for details).    MDM Rules/Calculators/A&P                          Patient presents Covid positive requesting relief from symptoms.  He has not been taking any medications over the past couple days.  He has the characteristic symptoms from Covid but no other extreme symptoms including chest pain, difficulty breathing, swelling of his lower extremities.  Patient's nausea will be treated with a GI cocktail secondary to his current psych meds and risk for QT prolongation with any other medication.  I have had a lengthy discussion with the patient in regards to quarantine, alternating Tylenol/ibuprofen and diet ideas. He states he currently is not taking his ultracet or mobiq and doesn't have active prescriptions for them.  I will give him printed out information in regards to Covid and treatment plan.  I given strict return to ED instructions and he understands, he offers no new concerns or complaints. Final Clinical Impression(s) / ED Diagnoses Final diagnoses:  COVID-19    Rx / DC Orders ED Discharge Orders         Ordered    aluminum hydroxide (AMPHOJEL/ALTERNAGEL) 320 MG/5ML suspension  Every 6 hours PRN        08/21/20 2028    acetaminophen (TYLENOL) 325 MG tablet  Every 6 hours PRN        08/21/20 2033    ibuprofen (ADVIL) 600 MG tablet  Every 6 hours PRN        08/21/20 2033           Alan Riles, Clabe Seal, DO 08/21/20 2038    Rozelle Logan, DO 08/22/20 1933

## 2020-08-21 NOTE — ED Triage Notes (Signed)
Pt reports covid + 2 days ago. C/o cough, mild shob, fevers, chills, body aches, and burns from hot water in shower.

## 2020-08-21 NOTE — Discharge Instructions (Signed)
You have been seen and discharged from the emergency department.  Alternate Tylenol and ibuprofen for fever/chill control.  You may take a dose of Tylenol, 3 to 4 hours later take a dose of ibuprofen and continue alternating.  Be sure to eat with these dosages.  Drink medication for nausea/upset stomach.  Follow bland diet till your appetite returns.  You are to quarantine and isolate to prevent the spread of COVID-19.  Take home medications as prescribed. If you have any worsening symptoms or further concerns or health please return to emergency department for further evaluation.

## 2020-08-25 ENCOUNTER — Other Ambulatory Visit: Payer: Self-pay

## 2020-08-25 ENCOUNTER — Emergency Department (HOSPITAL_BASED_OUTPATIENT_CLINIC_OR_DEPARTMENT_OTHER)
Admission: EM | Admit: 2020-08-25 | Discharge: 2020-08-26 | Disposition: A | Discharge status: Home / Self Care | Payer: HRSA Program | Attending: Emergency Medicine | Admitting: Emergency Medicine

## 2020-08-25 ENCOUNTER — Encounter (HOSPITAL_BASED_OUTPATIENT_CLINIC_OR_DEPARTMENT_OTHER): Payer: Self-pay | Admitting: *Deleted

## 2020-08-25 DIAGNOSIS — Z87891 Personal history of nicotine dependence: Secondary | ICD-10-CM | POA: Diagnosis not present

## 2020-08-25 DIAGNOSIS — J1282 Pneumonia due to coronavirus disease 2019: Secondary | ICD-10-CM | POA: Diagnosis not present

## 2020-08-25 DIAGNOSIS — U071 COVID-19: Secondary | ICD-10-CM | POA: Diagnosis not present

## 2020-08-25 DIAGNOSIS — N189 Chronic kidney disease, unspecified: Secondary | ICD-10-CM | POA: Diagnosis not present

## 2020-08-25 DIAGNOSIS — R0602 Shortness of breath: Secondary | ICD-10-CM | POA: Diagnosis present

## 2020-08-25 MED ORDER — ALBUTEROL SULFATE HFA 108 (90 BASE) MCG/ACT IN AERS
2.0000 | INHALATION_SPRAY | Freq: Once | RESPIRATORY_TRACT | Status: AC
  Administered 2020-08-26: 2 via RESPIRATORY_TRACT
  Filled 2020-08-25: qty 6.7

## 2020-08-25 NOTE — ED Provider Notes (Signed)
[facility] EMERGENCY DEPARTMENT Provider Note   CSN: [id number] Arrival date & time: 08/25/20  2155   History Chief Complaint  Patient presents with  . Shortness of Breath    Tony Hayes is a 29 y.o. male.  The history is provided by the patient.  Shortness of Breath He has history of bipolar disorder and comes in with symptoms related to COVID-19.  He states he was diagnosed positive with COVID-19 with symptoms having started about 9 days ago.  He has been running subjective fevers at home as well as chills and sweats.  There has been a nonproductive cough.  There has been some nausea and vomiting with some blood-streaked emesis.  He is complaining of generalized body aches.  He is also complaining of shortness of breath.  He has not been doing anything to treat his symptoms.  He states he did stop smoking when he started getting symptomatic.  Past Medical History:  Diagnosis Date  . Anxiety   . Bipolar disorder (HCC)   . Chronic kidney disease    kidney stone  . Depression   . Headache   . Heart murmur 5 years ago   mother states she doesn't know name for heart condition, possible murmer    Patient Active Problem List   Diagnosis Date Noted  . Panic disorder 09/20/2019  . 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 Generalized anxiety disorder 11/22/2018  . 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 CYSTOSCOPY WITH RETROGRADE PYELOGRAM, URETEROSCOPY AND STENT PLACEMENT Right 09/05/2014   Procedure: CYSTOSCOPY WITH RIGHT RETROGRADE PYELOGRAM AND STENT PLACEMENT;  Surgeon: Kathi Ludwig, MD;  Location: [facility];  Service: Urology;  Laterality: Right;  . CYSTOSCOPY WITH RETROGRADE PYELOGRAM, URETEROSCOPY AND  STENT PLACEMENT Right 10/21/2014   Procedure: CYSTOSCOPY WITH RIGHT RETROGRADE PYELOGRAM, RIGHT URETEROSCOPY AND  STONE EXTRACTION ,STENT PLACEMENT;  Surgeon: Kathi Ludwig, MD;  Location: [facility];  Service: Urology;  Laterality: Right;  . HOLMIUM LASER APPLICATION Right 10/21/2014   Procedure: HOLMIUM LASER ;  Surgeon: Kathi Ludwig, MD;  Location: [facility];  Service: Urology;  Laterality: Right;  . undescended testicle     surgery to correct at 29 year old  . WISDOM TOOTH EXTRACTION     all removed at age 45       No family history on file.  Social History   Tobacco Use  . Smoking status: Former Smoker    Years: 1.00    Types: E-cigarettes    Quit date: 01/12/2019    Years since quitting: 1.6  . Smokeless tobacco: Never Used  Vaping Use  . Vaping Use: Former  . Quit date: 01/12/2019  Substance Use Topics  . Alcohol use: Not Currently    Alcohol/week: 2.0 standard drinks    Types: 2 Standard drinks or equivalent per week    Comment: social  . Drug use: Not Currently    Types: Other-see comments    Comment: bath salts -  crystal meth  4 yrs ago    Home Medications Prior to Admission medications   Medication Sig Start Date End Date Taking? Authorizing Provider  acetaminophen (TYLENOL) 325 MG tablet Take 2 tablets (650 mg total) by mouth every 6 (six) hours as needed. 08/21/20   Horton, Clabe Seal, DO  aluminum hydroxide (  AMPHOJEL/ALTERNAGEL) 320 MG/5ML suspension Take 10 mLs by mouth every 6 (six) hours as needed for up to 7 days for indigestion or heartburn. 08/21/20 08/28/20  Horton, Clabe Seal, DO  ibuprofen (ADVIL) 600 MG tablet Take 1 tablet (600 mg total) by mouth every 6 (six) hours as needed for up to 30 doses. 08/21/20   Horton, Clabe Seal, DO  lamoTRIgine (LAMICTAL) 150 MG tablet Take 2 tablets (300 mg total) by mouth at bedtime. 03/19/20   Chauncey Mann, MD  ondansetron (ZOFRAN-ODT) 4 MG disintegrating tablet Take 4 mg by mouth every 8 (eight) hours as needed for  nausea.     [provider]  PARoxetine (PAXIL) 30 MG tablet Take 1 tablet (30 mg total) by mouth at bedtime. 03/19/20   Chauncey Mann, MD  phenazopyridine (PYRIDIUM) 200 MG tablet Take 1 tablet (200 mg total) by mouth 3 (three) times daily as needed for pain. 10/21/14   Jethro Bolus, MD  QUEtiapine (SEROQUEL) 50 MG tablet Take 1 tablet (50 mg total) by mouth at bedtime. 03/19/20   Chauncey Mann, MD  tamsulosin (FLOMAX) 0.4 MG CAPS capsule Take 0.4 mg by mouth daily. 09/19/14   [provider]  trimethoprim (TRIMPEX) 100 MG tablet Take 1 tablet (100 mg total) by mouth 1 day or 1 dose. 10/21/14   Jethro Bolus, MD    Allergies    Patient has no known allergies.  Review of Systems   Review of Systems  Respiratory: Positive for shortness of breath.   All other systems reviewed and are negative.   Physical Exam Updated Vital Signs BP 121/76   Pulse 77   Temp 98.3 F (36.8 C) (Oral)   Resp 18   Ht 5\' 9"  (1.753 m)   Wt 108 kg   SpO2 98%   BMI 35.16 kg/m   Physical Exam Vitals and nursing note reviewed.   29 year old male, resting comfortably and in no acute distress. Vital signs are normal. Oxygen saturation is 98%, which is normal. Head is normocephalic and atraumatic. PERRLA, EOMI. Oropharynx is clear. Neck is nontender and supple without adenopathy or JVD. Back is nontender and there is no CVA tenderness. Lungs are clear without rales, wheezes, or rhonchi. Chest is nontender. Heart has regular rate and rhythm without murmur. Abdomen is soft, flat, nontender without masses or hepatosplenomegaly and peristalsis is hypoactive. Extremities have no cyanosis or edema, full range of motion is present. Skin is warm and dry without rash. Neurologic: Mental status is normal, cranial nerves are intact, there are no motor or sensory deficits.  ED Results / Procedures / Treatments    EKG EKG Interpretation  Date/Time:  Tuesday August 26 2020 00:09:09  EST Ventricular Rate:  71 PR Interval:    QRS Duration: 96 QT Interval:  378 QTC Calculation: 411 R Axis:   74 Text Interpretation: Sinus rhythm Baseline wander in lead(s) V2 When compared with ECG of 05/02/2019, No significant change was found Confirmed by 05/04/2019 (Dione Booze) on 08/26/2020 12:26:54 AM   Radiology DG Chest Port 1 View  Result Date: 08/26/2020 CLINICAL DATA:  COVID positive.  Cough, shortness of breath EXAM: PORTABLE CHEST 1 VIEW COMPARISON:  08/21/2020 FINDINGS: Worsening hazy patchy bilateral airspace opacities, best seen in the upper lobes. Heart is normal size. No effusions or pneumothorax. No acute bony abnormality. IMPRESSION: Hazy patchy bilateral airspace disease, worsening since prior study compatible with COVID pneumonia. Electronically Signed   By: 14/05/2020 M.D.   On:  08/26/2020 00:14    Procedures Procedures  Medications Ordered in ED Medications - No data to display  ED Course  I have reviewed the triage vital signs and the nursing notes.  Pertinent imaging results that were available during my care of the patient were reviewed by me and considered in my medical decision making (see chart for details).  MDM Rules/Calculators/A&P COVID-19 infection.  Oxygen saturations on room air are adequate, old records reviewed confirming recent ED visit for COVID-19.  Will recheck chest x-ray.  Nausea and vomiting is a major symptom, will check ECG to make sure QT intervals not prolonged before giving antiemetics.  We will give therapeutic trial of albuterol.  ECG shows normal QT interval, he is given a dose of ondansetron.  Chest x-ray does show worsening infiltrates.  He is just barely within the timeframe seen.  Monoclonal antibody infusion.  This is ordered in the ED.  NOBUO NUNZIATA was evaluated in Emergency Department on 08/26/2020 for the symptoms described in the history of present illness. He was evaluated in the context of the global COVID-19  pandemic, which necessitated consideration that the patient might be at risk for infection with the SARS-CoV-2 virus that causes COVID-19. Institutional protocols and algorithms that pertain to the evaluation of patients at risk for COVID-19 are in a state of rapid change based on information released by regulatory bodies including the CDC and federal and state organizations. These policies and algorithms were followed during the patient's care in the ED.  Final Clinical Impression(s) / ED Diagnoses Final diagnoses:  Pneumonia due to COVID-19 virus    Rx / DC Orders ED Discharge Orders    None       Dione Booze, MD 08/26/20 [phone]

## 2020-08-25 NOTE — ED Triage Notes (Signed)
Covid positive a week ago. Here with sob. He had a CXR 4 days ago.

## 2020-08-26 ENCOUNTER — Emergency Department (HOSPITAL_BASED_OUTPATIENT_CLINIC_OR_DEPARTMENT_OTHER): Payer: HRSA Program

## 2020-08-26 ENCOUNTER — Other Ambulatory Visit: Payer: Self-pay

## 2020-08-26 LAB — RESP PANEL BY RT-PCR (FLU A&B, COVID) ARPGX2
Influenza A by PCR: NEGATIVE
Influenza B by PCR: NEGATIVE
SARS Coronavirus 2 by RT PCR: POSITIVE — AB

## 2020-08-26 MED ORDER — METHYLPREDNISOLONE SODIUM SUCC 125 MG IJ SOLR: 125.0000 mg | Freq: Once | INTRAMUSCULAR | Status: DC | PRN

## 2020-08-26 MED ORDER — ONDANSETRON 4 MG PO TBDP
8.0000 mg | ORAL_TABLET | Freq: Once | ORAL | Status: AC
  Administered 2020-08-26: 8 mg via ORAL
  Filled 2020-08-26: qty 2

## 2020-08-26 MED ORDER — FAMOTIDINE IN NACL 20-0.9 MG/50ML-% IV SOLN: 20.0000 mg | Freq: Once | INTRAVENOUS | Status: DC | PRN

## 2020-08-26 MED ORDER — ONDANSETRON HCL 4 MG PO TABS: 4.0000 mg | ORAL_TABLET | Freq: Four times a day (QID) | ORAL | 0 refills | Status: DC | PRN

## 2020-08-26 MED ORDER — DIPHENHYDRAMINE HCL 50 MG/ML IJ SOLN: 50.0000 mg | Freq: Once | INTRAMUSCULAR | Status: DC | PRN

## 2020-08-26 MED ORDER — EPINEPHRINE 0.3 MG/0.3ML IJ SOAJ: 0.3000 mg | Freq: Once | INTRAMUSCULAR | Status: DC | PRN

## 2020-08-26 MED ORDER — ALBUTEROL SULFATE HFA 108 (90 BASE) MCG/ACT IN AERS: 2.0000 | INHALATION_SPRAY | Freq: Once | RESPIRATORY_TRACT | Status: DC | PRN

## 2020-08-26 MED ORDER — SODIUM CHLORIDE 0.9 % IV SOLN
INTRAVENOUS | Status: DC | PRN
  Administered 2020-08-26: 250 mL via INTRAVENOUS

## 2020-08-26 MED ORDER — SODIUM CHLORIDE 0.9 % IV SOLN
1200.0000 mg | Freq: Once | INTRAVENOUS | Status: AC
  Administered 2020-08-26: 1200 mg via INTRAVENOUS
  Filled 2020-08-26: qty 10

## 2020-08-26 NOTE — Discharge Instructions (Addendum)
Use the inhaler, 2 puffs every 4 hours as needed, for shortness of breath or coughing.  Return to the emergency department if your breathing is getting worse.

## 2020-09-17 ENCOUNTER — Ambulatory Visit (INDEPENDENT_AMBULATORY_CARE_PROVIDER_SITE_OTHER): Payer: Self-pay | Admitting: Psychiatry

## 2020-09-17 ENCOUNTER — Other Ambulatory Visit: Payer: Self-pay

## 2020-09-17 ENCOUNTER — Ambulatory Visit: Payer: Self-pay | Admitting: Psychiatry

## 2020-09-17 ENCOUNTER — Encounter: Payer: Self-pay | Admitting: Psychiatry

## 2020-09-17 VITALS — Ht 67.0 in | Wt 245.0 lb

## 2020-09-17 DIAGNOSIS — F340 Cyclothymic disorder: Secondary | ICD-10-CM

## 2020-09-17 DIAGNOSIS — F902 Attention-deficit hyperactivity disorder, combined type: Secondary | ICD-10-CM

## 2020-09-17 DIAGNOSIS — F88 Other disorders of psychological development: Secondary | ICD-10-CM

## 2020-09-17 DIAGNOSIS — F411 Generalized anxiety disorder: Secondary | ICD-10-CM

## 2020-09-17 DIAGNOSIS — F410 Panic disorder [episodic paroxysmal anxiety] without agoraphobia: Secondary | ICD-10-CM

## 2020-09-17 MED ORDER — LAMOTRIGINE 150 MG PO TABS: 300.0000 mg | ORAL_TABLET | Freq: Every day | ORAL | 1 refills | Status: DC

## 2020-09-17 MED ORDER — QUETIAPINE FUMARATE 50 MG PO TABS: 50.0000 mg | ORAL_TABLET | Freq: Every day | ORAL | 1 refills | Status: DC

## 2020-09-17 MED ORDER — PAROXETINE HCL 30 MG PO TABS: 30.0000 mg | ORAL_TABLET | Freq: Every day | ORAL | 1 refills | Status: DC

## 2020-09-17 NOTE — Progress Notes (Signed)
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  Adoptive mother has 1 year to work before retirement at 10 years while adoptive father retired in his mid 53s.  Mother emphasizes the patient had increased blood pressure seemingly during his COVID infection of several weeks ago, but his trainer now attempts to undo the consequences and restore his optimal cardiometabolics.  However adoptive mother notes the patient was using 4 or 5 energy drinks a day as likely the source of his increased blood pressure, and he stopped that last week.  Patient has no insurance at this time but a new job at [employer] and comes for appointment aware of my case closure for imminent retirement as he needs to plan longer term care expecting to have insurance in the near future.  He did stop Valium last appointment mother in the lobby then to assure that any medication changes were processed with her as well as the patient.  [place] registry documents last Valium dispensing of the 10 mg on 09/20/2019.  He signed a parental release today to consent that mother be a part of his decision making and access to medical records.  Overall, the family household is very busy but he is doing well enough with the family.  He has no mania, suicidality, psychosis or delirium.   Individual Medical History/ Review of Systems: Changes? :Yes weight is up 7 pounds in 6 months as  he gets well from covid.  Allergies: Patient has no known allergies.  Current Medications:  Current Outpatient Medications:  .  acetaminophen (TYLENOL) 325 MG tablet, Take 2 tablets (650 mg total) by mouth every 6 (six) hours as needed., Disp: 30 tablet, Rfl: 0 .  ibuprofen (ADVIL) 600 MG tablet, Take 1 tablet (600 mg total) by mouth every 6 (six) hours as needed for up to 30 doses., Disp: 30 tablet, Rfl: 0 .  lamoTRIgine (LAMICTAL) 150 MG tablet, Take 2 tablets (300 mg total) by mouth at bedtime., Disp: 180 tablet, Rfl: 1 .  ondansetron (ZOFRAN) 4 MG tablet, Take 1 tablet (4 mg total) by mouth every 6 (six) hours as needed for nausea or vomiting., Disp: 20 tablet, Rfl: 0 .  PARoxetine (PAXIL) 30 MG tablet, Take 1 tablet (30 mg total) by mouth at bedtime., Disp: 90 tablet, Rfl: 1 .  QUEtiapine (SEROQUEL) 50 MG tablet, Take 1 tablet (50 mg total) by mouth at bedtime., Disp: 90 tablet, Rfl: 1 .  tamsulosin (FLOMAX) 0.4 MG CAPS capsule, Take 0.4 mg by mouth daily., Disp: , Rfl: 0 .  trimethoprim (TRIMPEX) 100 MG tablet, Take 1 tablet (100 mg total) by mouth 1 day or 1 dose., Disp: 30 tablet, Rfl: 1  Medication Side Effects: none  Family Medical/ Social History: Changes? No  MENTAL HEALTH EXAM:  Height 5\' 7"  (1.702 m), weight 245 lb (111.1 kg).Body mass index is 38.37 kg/m. Muscle strengths and tone 5/5, postural reflexes and gait 0/0, and AIMS = 0.  General Appearance: Casual, Meticulous,  Well Groomed and Obese  Eye Contact:  Good  Speech:  Clear and Coherent, Normal Rate and Talkative  Volume:  Normal  Mood:  Anxious, Dysphoric and Euthymic  Affect:  Congruent, Depressed, Inappropriate, Full Range and Anxious  Thought Process:  Coherent, Goal Directed, Irrelevant, Linear and Descriptions of Associations: Circumstantial  Orientation:  Full (Time, Place, and Person)  Thought Content: Obsessions and Rumination   Suicidal Thoughts:  No  Homicidal Thoughts:  No  Memory:  Immediate;   Good  and Fair Remote;   Good and Fair  Judgement:  Fair  Insight:  Fair  Psychomotor Activity:  Normal and Mannerisms  Concentration:  Concentration: Fair and Attention Span: Fair  Recall:  [employer] of Knowledge: Good  Language: Fair  Assets:  Desire for Improvement Leisure Time Resilience Talents/Skills Vocational/Educational  ADL's:  Intact  Cognition: WNL  Prognosis:  Fair    DIAGNOSES:    ICD-10-CM   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 Generalized anxiety disorder  F41.1 QUEtiapine (SEROQUEL) 50 MG tablet    PARoxetine (PAXIL) 30 MG tablet  3. 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 Panic disorder  F41.0 QUEtiapine (SEROQUEL) 50 MG tablet    PARoxetine (PAXIL) 30 MG tablet  5. Secondary neurodevelopmental disorder  F88 QUEtiapine (SEROQUEL) 50 MG tablet    lamoTRIgine (LAMICTAL) 150 MG tablet    Receiving Psychotherapy: No    RECOMMENDATIONS: Support and education update prevention and monitoring safety hygiene for medications and diagnoses as patient works with mother on a stepwise plan to optimally restore his reserve and resources repertoire for work and home.  At this time he will continue his medications without change not requiring Valium.  He is E scribed Lamictal 150 mg to take 2 tabs total 300 mg #180 with 1 refill sent to [facility] for cyclothymia and neurodevelopmental disorder. He is ecscribed Seroquel 50 mg every bedtime #90 with 1 refill to [facility] for cyclothymia and generalized/panic disorders. Paxil is sent 30 mg every bedtime as #90 with 1 refill for cyclothymia, generalized anixety, and panic disorders sent to [facility].Care closure plans follow up transition transfer to see Corie Chiquito, PMHNP iin 6 months. He completes the adult consent DPR to share records and information with adoptive mother  when helpful to any.   Chauncey Mann, MD

## 2021-03-02 ENCOUNTER — Other Ambulatory Visit: Payer: Self-pay

## 2021-03-02 ENCOUNTER — Ambulatory Visit (INDEPENDENT_AMBULATORY_CARE_PROVIDER_SITE_OTHER): Payer: Self-pay | Admitting: Psychiatry

## 2021-03-02 ENCOUNTER — Encounter: Payer: Self-pay | Admitting: Psychiatry

## 2021-03-02 ENCOUNTER — Telehealth: Payer: Self-pay | Admitting: Psychiatry

## 2021-03-02 DIAGNOSIS — F410 Panic disorder [episodic paroxysmal anxiety] without agoraphobia: Secondary | ICD-10-CM

## 2021-03-02 DIAGNOSIS — F88 Other disorders of psychological development: Secondary | ICD-10-CM

## 2021-03-02 DIAGNOSIS — F902 Attention-deficit hyperactivity disorder, combined type: Secondary | ICD-10-CM

## 2021-03-02 DIAGNOSIS — F340 Cyclothymic disorder: Secondary | ICD-10-CM

## 2021-03-02 DIAGNOSIS — F411 Generalized anxiety disorder: Secondary | ICD-10-CM

## 2021-03-02 MED ORDER — RISPERIDONE 1 MG PO TABS: ORAL_TABLET | ORAL | 1 refills | Status: DC

## 2021-03-02 MED ORDER — PAROXETINE HCL 30 MG PO TABS: 30.0000 mg | ORAL_TABLET | Freq: Every day | ORAL | 1 refills | Status: DC

## 2021-03-02 MED ORDER — LAMOTRIGINE 150 MG PO TABS
300.0000 mg | ORAL_TABLET | Freq: Every day | ORAL | 1 refills | Status: DC
Start: 2021-03-02 — End: 2021-10-09
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
## 2021-03-02 NOTE — Telephone Encounter (Signed)
Patients mother Brannan Cassedy would like a call to discuss his medications. Please call [phone]. She does have a release on file.

## 2021-03-02 NOTE — Telephone Encounter (Signed)
Spoke to debra and she stated Tony Hayes was previously on Risperdal and he had a severe reaction to it.It caused him hallucinations and nightmares.He did not realize this at his appt.[facility] did not have the medication yet so they didn't pick it up.She is requesting a different medication for him to try.

## 2021-03-02 NOTE — Patient Instructions (Signed)
-  Stop Quetiapine (Seroquel) at bedtime. -Start Risperidone (Risperdal) 1/2 tablet at bedtime for 3-5 nights, then increase to 1 tablet at bedtime as tolerated. Risperdal is in the same class of medication as Quetiapine. It may help more with mood, anxiety, and thoughts and possibly be less likely to cause excessive sleepiness.  -Continue all other medications.

## 2021-03-02 NOTE — Telephone Encounter (Signed)
LVM for Tony Hayes to return call

## 2021-03-04 NOTE — Telephone Encounter (Signed)
Returned call to pt's mother. L/M that provider would like to speak with her to discuss pt's medication treatment history and to find out how he has responded to past medications. Discussed provider would attempt calling her around 4 pm today and if unable to reach her, would continue to call her another day.

## 2021-03-04 NOTE — Telephone Encounter (Signed)
Attempted to reach patient's mother again.  No answer.

## 2021-03-05 MED ORDER — ARIPIPRAZOLE 5 MG PO TABS: 5.0000 mg | ORAL_TABLET | Freq: Every day | ORAL | 0 refills | Status: DC

## 2021-03-05 NOTE — Telephone Encounter (Signed)
Returned call to pt's mother. Mother reports that he has not complained to them about medication. Mother reports that he sleep excessively at times for years.   She reports that he was on Risperdal as a teenager. She reports that he had excessive somnolence, tardive dyskinesia, elevated prolactin/gynecomastia, and hallucinations.   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   Mother reports that in the past he was taking steroids. She reports that he will drink energy drinks.   She reports that he has periods of eating carbohydrates excessively.   Discussed not starting Risperdal as planned since patient was previously on Risperdal and had multiple side effects.  Discussed potential benefits, risks, and side effects of Abilify.  Mother reports that she does not recall patient having been on Abilify in the past and agrees to trial of Abilify.  Will send prescription of Abilify 5 mg daily to pharmacy.   Past Psychiatric Medication Trials: Risperdal- Multiple adverse effects Seroquel Lamictal- Helpful for agitation Paxil Diazepam  Xanax

## 2021-03-05 NOTE — Addendum Note (Signed)
Addended by: Derenda Mis on: 03/05/2021 12:56 PM   Modules accepted: Orders

## 2021-03-09 ENCOUNTER — Ambulatory Visit: Payer: Self-pay | Admitting: Behavioral Health

## 2021-03-18 ENCOUNTER — Ambulatory Visit: Payer: Self-pay | Admitting: Psychiatry

## 2021-04-09 ENCOUNTER — Other Ambulatory Visit: Payer: Self-pay

## 2021-04-09 ENCOUNTER — Ambulatory Visit: Payer: Self-pay | Admitting: Mental Health

## 2021-04-09 DIAGNOSIS — F411 Generalized anxiety disorder: Secondary | ICD-10-CM

## 2021-04-09 NOTE — Progress Notes (Signed)
      [facility] Counselor/Therapist Progress Note  Patient ID: Tony Hayes, MRN: [id number],    Date: 04/09/2021  Time Spent: 50 minutes   Treatment Type: Individual Therapy  Reported Symptoms: Depressed mood, anhedonia, anxiety    Mental Status Exam:    Appearance:    Casual     Behavior:   Appropriate  Motor:   WNL  Speech/Language:    Clear and Coherent  Affect:   Full range   Mood:   Depressed, pleasant  Thought process:   normal  Thought content:     WNL  Sensory/Perceptual disturbances:     none  Orientation:   x4  Attention:   Good  Concentration:   Good  Memory:   Intact  Fund of knowledge:    Consistent with age and development  Insight:     Good  Judgment:    Good  Impulse Control:   Good     Risk Assessment: Danger to Self:  No Self-injurious Behavior: No Danger to Others: No Duty to Warn:no Physical Aggression / Violence:No  Access to Firearms a concern: No  Gang Involvement:No   Subjective: Patient shared progress since last session which was about two years ago. You said that he has been dating someone who recently broke up with him about two days ago for the past two years. Details were shared related to the breakup, how he was surprised at the time, although he stated that she broke up with him about a year ago. He stated that she needed "space". He considers her introverted and himself, extroverted. Facilitated patient further identifying any potential concerns about the relationship where he identified her having a problem with alcohol, that she drinks excessively most nights to the point where she can become highly irritable and at times, physically nauseous. Through guided discovery, he identified the need to further care for himself by having a more meaningful discussion with his girlfriend if she asked to restart their relationship, which you feel she will do in the coming days.   Interventions: Cognitive Behavioral Therapy, Ego-Supportive,  and Insight-Oriented  Diagnosis:   ICD-10-CM   1. Generalized anxiety disorder  F41.1       Plan: return to therapy. TBD further next visit  [name] Session, [facility]

## 2021-05-04 ENCOUNTER — Ambulatory Visit: Payer: Self-pay | Admitting: Mental Health

## 2021-05-19 ENCOUNTER — Ambulatory Visit (INDEPENDENT_AMBULATORY_CARE_PROVIDER_SITE_OTHER): Payer: Self-pay | Admitting: Mental Health

## 2021-05-19 ENCOUNTER — Other Ambulatory Visit: Payer: Self-pay

## 2021-05-19 DIAGNOSIS — F411 Generalized anxiety disorder: Secondary | ICD-10-CM

## 2021-05-19 NOTE — Progress Notes (Signed)
[facility] Counselor/Therapist Progress Note  Patient ID: Tony Hayes, MRN: [id number],    Date: 05/19/2021  Time Spent: 51 minutes   Treatment Type: Individual Therapy  Reported Symptoms: Depressed mood, anhedonia, anxiety    Mental Status Exam:    Appearance:    Casual     Behavior:   Appropriate  Motor:   WNL  Speech/Language:    Clear and Coherent  Affect:   Full range   Mood:   Depressed, pleasant  Thought process:   normal  Thought content:     WNL  Sensory/Perceptual disturbances:     none  Orientation:   x4  Attention:   Good  Concentration:   Good  Memory:   Intact  Fund of knowledge:    Consistent with age and development  Insight:     Good  Judgment:    Good  Impulse Control:   Good     Risk Assessment: Danger to Self:  No Self-injurious Behavior: No Danger to Others: No Duty to Warn:no Physical Aggression / Violence:No  Access to Firearms a concern: No  Gang Involvement:No   Subjective: Patient stated he got back together w/ his ex-girlfriend.  He identified and shared ways he feels he is changed over the last few weeks, how he has gotten more used to being alone, he said he and his girlfriend are talking again but not back together as a couple.  He identified the need to make the decision about being in the relationship again slowly as he wants to work on himself as well as further understand his girlfriend.  He identified feeling a sense of abandonment due to her breaking up with him about a month and a half ago, parallel this to a sense of abandonment he got when having contact with his biological mother years ago.  He stated that he was able to locate his mother through assistance from an uncle at the time when he was age 30.  Patient stated that he had contact with his mother over a period of about 2 months, how he was able to ask many questions however, she left again abruptly where he was unable to reach her.  He came to understand that she  coped with mental health issues and would often engage in impulsive, reckless behaviors per his aunt.  Patient stated he understands more and has a sense of closure about who his mother was and how it does not affect him as it did a few years ago.  He does however feel he continues to carry a sense of abandonment.  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  Facilitated his identifying thoughts he has in these moments while encouraging him to further identify and increase awareness between sessions.  Also we facilitated his identifying more rational, calming thoughts with which to cope in these moments.    Interventions: Cognitive Behavioral Therapy, Ego-Supportive, and Insight-Oriented  Diagnosis:   ICD-10-CM   1. Generalized anxiety disorder  F41.1        Plan: Patient is to allow himself time to consider the relationship w/ his gf before committing again. Patient to work to abstain from viewing pornography.  Patient to increase awareness of cognitions that lead to more anxiety.   Long-term goal:   Reduce overall level, frequency,  and intensity anxiety and panic per client report for at least 3 consecutive months.  Short-term goal:  Identifying specific situations/setting that leads to increased feelings of anxiety and panic Verbally express understanding of the relationship between feelings of depression, anxiety and their impact on thinking patterns and behaviors. Verbalize an understanding of the role that distorted thinking plays in creating fears, excessive worry, and ruminations.    [name] Session, [facility]

## 2021-05-30 ENCOUNTER — Other Ambulatory Visit: Payer: Self-pay | Admitting: Psychiatry

## 2021-05-30 DIAGNOSIS — F340 Cyclothymic disorder: Secondary | ICD-10-CM

## 2021-07-17 ENCOUNTER — Ambulatory Visit: Payer: Self-pay | Admitting: Mental Health

## 2021-08-03 ENCOUNTER — Ambulatory Visit (INDEPENDENT_AMBULATORY_CARE_PROVIDER_SITE_OTHER): Payer: Self-pay | Admitting: Mental Health

## 2021-08-03 ENCOUNTER — Other Ambulatory Visit: Payer: Self-pay

## 2021-08-03 DIAGNOSIS — F411 Generalized anxiety disorder: Secondary | ICD-10-CM

## 2021-08-03 NOTE — Progress Notes (Signed)
      [facility] Counselor/Therapist Progress Note  Patient ID: Tony Hayes, MRN: [id number],    Date: 08/03/2021  Time Spent: 52 minutes   Treatment Type: Individual Therapy  Reported Symptoms: Depressed mood, anhedonia, anxiety    Mental Status Exam:    Appearance:    Casual     Behavior:   Appropriate  Motor:   WNL  Speech/Language:    Clear and Coherent  Affect:   Full range   Mood:   Depressed, pleasant  Thought process:   normal  Thought content:     WNL  Sensory/Perceptual disturbances:     none  Orientation:   x4  Attention:   Good  Concentration:   Good  Memory:   Intact  Fund of knowledge:    Consistent with age and development  Insight:     Good  Judgment:    Good  Impulse Control:   Good     Risk Assessment: Danger to Self:  No Self-injurious Behavior: No Danger to Others: No Duty to Warn:no Physical Aggression / Violence:No  Access to Firearms a concern: No  Gang Involvement:No   Subjective: Patient presents on time for today's session.  He shared recent events.  He stated that his girlfriend broke up with him about a month and a half ago, at the time, this was devastating where patient stated he felt depressed and lost about 20 pounds.  Patient stated he has been doing better over the last several weeks, has been working out and eating healthy.  He stated "I use the pain in a positive way".  He went on to share how he continues to cope with anxiety, particularly in social situations.  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  He worries this could recur.  We discussed coping strategies, pausing prior to going into work to allow time to identify thoughts about how he wants to respond and handle work stress that may occur.  He agreed to follow through with his homework  assignment.   Interventions: Cognitive Behavioral Therapy, Ego-Supportive, and Insight-Oriented  Diagnosis:   ICD-10-CM   1. Generalized anxiety disorder  F41.1         Plan: Patient to utilize coping skills as discussed, utilize his support system, continue to take steps toward health and wellness by continuing his exercise regimen.   Long-term goal:   Reduce overall level, frequency, and intensity anxiety and panic per client report for at least 3 consecutive months.  Short-term goal:  Identifying specific situations/setting that leads to increased feelings of anxiety and panic Verbally express understanding of the relationship between feelings of depression, anxiety and their impact on thinking patterns and behaviors. Verbalize an understanding of the role that distorted thinking plays in creating fears, excessive worry, and ruminations.    Tony Hayes Session, [facility]

## 2021-08-26 ENCOUNTER — Other Ambulatory Visit: Payer: Self-pay | Admitting: Psychiatry

## 2021-08-26 DIAGNOSIS — F340 Cyclothymic disorder: Secondary | ICD-10-CM

## 2021-08-26 NOTE — Telephone Encounter (Signed)
Please schedule appt

## 2021-08-27 NOTE — Telephone Encounter (Signed)
And put on the cancellation list

## 2021-08-27 NOTE — Telephone Encounter (Signed)
Next appt 1/27

## 2021-09-01 ENCOUNTER — Ambulatory Visit: Payer: Self-pay | Admitting: Psychiatry

## 2021-10-09 ENCOUNTER — Other Ambulatory Visit: Payer: Self-pay

## 2021-10-09 ENCOUNTER — Ambulatory Visit (INDEPENDENT_AMBULATORY_CARE_PROVIDER_SITE_OTHER): Payer: 59 | Admitting: Psychiatry

## 2021-10-09 ENCOUNTER — Encounter: Payer: Self-pay | Admitting: Psychiatry

## 2021-10-09 DIAGNOSIS — F340 Cyclothymic disorder: Secondary | ICD-10-CM

## 2021-10-09 DIAGNOSIS — F88 Other disorders of psychological development: Secondary | ICD-10-CM | POA: Diagnosis not present

## 2021-10-09 DIAGNOSIS — F410 Panic disorder [episodic paroxysmal anxiety] without agoraphobia: Secondary | ICD-10-CM

## 2021-10-09 DIAGNOSIS — F902 Attention-deficit hyperactivity disorder, combined type: Secondary | ICD-10-CM

## 2021-10-09 DIAGNOSIS — F411 Generalized anxiety disorder: Secondary | ICD-10-CM | POA: Diagnosis not present

## 2021-10-09 MED ORDER — ARIPIPRAZOLE 10 MG PO TABS: 10.0000 mg | ORAL_TABLET | Freq: Every day | ORAL | 0 refills | Status: DC

## 2021-10-09 MED ORDER — LAMOTRIGINE 150 MG PO TABS: 300.0000 mg | ORAL_TABLET | Freq: Every day | ORAL | 1 refills | Status: DC

## 2021-10-09 MED ORDER — PAROXETINE HCL 30 MG PO TABS: 30.0000 mg | ORAL_TABLET | Freq: Every day | ORAL | 1 refills | Status: DC

## 2021-10-09 NOTE — Progress Notes (Signed)
EDREES VALENT [id number] [birth year] 31 y.o.  Subjective:   Patient ID:  Tony Hayes is a 31 y.o. (DOB [birth year]) male.  Chief Complaint:  Chief Complaint  Patient presents with   Other    Impulsivity   Depression    HPI Tony Hayes presents to the office today for follow-up of anxiety and mood disturbance. He reports  that he has been making impulsive decisions. He reports that he lost $900 last night while trying to sign up for a dating ap. Spent $1,000 one evening while "bored." He also lost money at poker and pool. Some possible excessive talkativeness.   He reports that he lost 70 lbs after break up with long-time girlfriend a couple of months ago.He reports that he was also taking an OTC supplement for weight loss. He reports that he is not wanting to eat for fear that he will "get fat." He reports low motivation. He reports that he has some hopeless thoughts, "What's the point?... but not suicidal." Continued depressed mood. He reports some irritability at times with dealing with customers. Low energy. He reports poor concentration.   Had a panic attack at work yesterday and almost had to leave work. Having difficulty sleeping at times. Having n/v with anxiety.   Recently started smoking again and then went to vaping. Using Kratom and Delta 8 less.   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    He reports that his family is concerned that he is on drugs.   Past Psychiatric Medication Trials: Seroquel Risperdal-Increased prolactin Abilify Paxil Effexor Citalopram Remeron- slurred speech, blurred vision, staggering Lamictal Valium Intuniv Vyvanse Trazodone  Review of Systems:  Review of Systems  Musculoskeletal:  Negative for gait problem.  Neurological:  Positive for tremors.  Psychiatric/Behavioral:         Please refer to HPI   Medications: I have reviewed the patient's current medications.  Current Outpatient Medications  Medication Sig Dispense  Refill   ARIPiprazole (ABILIFY) 10 MG tablet Take 1 tablet (10 mg total) by mouth daily. 90 tablet 0   lamoTRIgine (LAMICTAL) 150 MG tablet Take 2 tablets (300 mg total) by mouth at bedtime. 180 tablet 1   PARoxetine (PAXIL) 30 MG tablet Take 1 tablet (30 mg total) by mouth at bedtime. 90 tablet 1   No current facility-administered medications for this visit.    Medication Side Effects: None  Allergies: No Known Allergies  Past Medical History:  Diagnosis Date   Anxiety    Bipolar disorder (HCC)    Chronic kidney disease    kidney stone   Depression    Headache    Heart murmur 5 years ago   mother states she doesn't know name for heart condition, possible murmer    Past Medical History, Surgical history, Social history, and Family history were reviewed and updated as appropriate.   Please see review of systems for further details on the patient's review from today.   Objective:   Physical Exam:  There were no vitals taken for this visit.  Physical Exam Constitutional:      General: He is not in acute distress. Musculoskeletal:        General: No deformity.  Neurological:     Mental Status: He is alert and oriented to person, place, and time.     Coordination: Coordination normal.  Psychiatric:        Attention and Perception: Attention and perception normal. He does not perceive auditory or visual hallucinations.  Mood and Affect: Mood is anxious and depressed. Affect is labile. Affect is not blunt, angry or inappropriate.        Speech: Speech normal.        Behavior: Behavior normal.        Thought Content: Thought content normal. Thought content is not paranoid or delusional. Thought content does not include homicidal or suicidal ideation. Thought content does not include homicidal or suicidal plan.        Cognition and Memory: Cognition and memory normal.        Judgment: Judgment normal.     Comments: Insight intact    Lab Review:     Component Value  Date/Time   NA 131 (L) 05/02/2019 1341   K 3.5 05/02/2019 1341   CL 96 (L) 05/02/2019 1341   CO2 23 05/02/2019 1341   GLUCOSE 131 (H) 05/02/2019 1341   BUN 12 05/02/2019 1341   CREATININE 1.39 (H) 05/02/2019 1341   CALCIUM 9.7 05/02/2019 1341   PROT 6.2 08/15/2008 0600   ALBUMIN 4.1 08/15/2008 0600   AST 17 08/15/2008 0600   ALT 12 08/15/2008 0600   ALKPHOS 78 08/15/2008 0600   BILITOT 0.6 08/15/2008 0600   GFRNONAA >60 05/02/2019 1341   GFRAA >60 05/02/2019 1341       Component Value Date/Time   WBC 8.4 05/02/2019 1341   RBC 5.22 05/02/2019 1341   HGB 15.9 05/02/2019 1341   HCT 47.5 05/02/2019 1341   PLT 291 05/02/2019 1341   MCV 91.0 05/02/2019 1341   MCH 30.5 05/02/2019 1341   MCHC 33.5 05/02/2019 1341   RDW 11.9 05/02/2019 1341   LYMPHSABS 2.0 09/05/2014 1255   MONOABS 1.3 (H) 09/05/2014 1255   EOSABS 0.1 09/05/2014 1255   BASOSABS 0.0 09/05/2014 1255    No results found for: POCLITH, LITHIUM   No results found for: PHENYTOIN, PHENOBARB, VALPROATE, CBMZ   .res Assessment: Plan:   Pt seen for 30 minutes and time spent discussing treatment options for mood s/s. Discussed potential benefits, risks, and side effects of increasing Abilify for mood stabilization and to target impulsivity and depression. Pt agrees to increase in Abilify to 10 mg po qd.  Continue Lamictal 300 mg po qd for mood.  Continue Paxil 30 mg po qd for depression and anxiety.  Pt to follow-up in 6 weeks or sooner if clinically indicated.  Patient advised to contact office with any questions, adverse effects, or acute worsening in signs and symptoms.   Yuvan was seen today for other and depression.  Diagnoses and all orders for this visit:  Cyclothymic disorder -     ARIPiprazole (ABILIFY) 10 MG tablet; Take 1 tablet (10 mg total) by mouth daily. -     lamoTRIgine (LAMICTAL) 150 MG tablet; Take 2 tablets (300 mg total) by mouth at bedtime.  Secondary neurodevelopmental disorder -      lamoTRIgine (LAMICTAL) 150 MG tablet; Take 2 tablets (300 mg total) by mouth at bedtime.  Generalized anxiety disorder -     PARoxetine (PAXIL) 30 MG tablet; Take 1 tablet (30 mg total) by mouth at bedtime.  Panic disorder -     PARoxetine (PAXIL) 30 MG tablet; Take 1 tablet (30 mg total) by mouth at bedtime.  Attention deficit hyperactivity disorder (ADHD), combined type, mild -     PARoxetine (PAXIL) 30 MG tablet; Take 1 tablet (30 mg total) by mouth at bedtime.     Please see After Visit Summary for patient specific  instructions.  Future Appointments  Date Time Provider Department Center  11/20/2021  9:00 AM Corie Chiquitoarter, Nieshia Larmon, PMHNP [facility] None    No orders of the defined types were placed in this encounter.   -------------------------------

## 2021-10-09 NOTE — Patient Instructions (Signed)
Recommend increasing Abilify to 10 mg daily since this should help with depressive symptoms and impulsivity. It is not approved for anxiety, however sometimes it is helpful for anxiety.

## 2021-11-20 ENCOUNTER — Other Ambulatory Visit: Payer: Self-pay

## 2021-11-20 ENCOUNTER — Encounter: Payer: Self-pay | Admitting: Psychiatry

## 2021-11-20 ENCOUNTER — Ambulatory Visit (INDEPENDENT_AMBULATORY_CARE_PROVIDER_SITE_OTHER): Payer: 59 | Admitting: Psychiatry

## 2021-11-20 VITALS — BP 107/71 | HR 78

## 2021-11-20 DIAGNOSIS — F340 Cyclothymic disorder: Secondary | ICD-10-CM

## 2021-11-20 DIAGNOSIS — F88 Other disorders of psychological development: Secondary | ICD-10-CM | POA: Diagnosis not present

## 2021-11-20 DIAGNOSIS — F410 Panic disorder [episodic paroxysmal anxiety] without agoraphobia: Secondary | ICD-10-CM

## 2021-11-20 MED ORDER — LAMOTRIGINE 150 MG PO TABS: 300.0000 mg | ORAL_TABLET | Freq: Every day | ORAL | 1 refills | Status: DC

## 2021-11-20 MED ORDER — PROPRANOLOL HCL 10 MG PO TABS: 10.0000 mg | ORAL_TABLET | Freq: Two times a day (BID) | ORAL | 3 refills | Status: DC | PRN

## 2021-11-20 MED ORDER — ARIPIPRAZOLE 10 MG PO TABS: 10.0000 mg | ORAL_TABLET | Freq: Every day | ORAL | 0 refills | Status: DC

## 2021-11-20 NOTE — Progress Notes (Signed)
Tony Hayes ?[id number] ?1991-07-12 ?30 y.o. ? ?Subjective:  ? ?Patient ID:  Tony Hayes is a 31 y.o. (DOB March 29, 1991) male. ? ?Chief Complaint:  ?Chief Complaint  ?Patient presents with  ? Anxiety  ? Other  ?  Mood disturbance  ? ? ?HPI ?OLUSEGUN GERSTENBERGER presents to the office today for follow-up of mood disturbance and anxiety. He reports mood has been "a lot better." He reports minimal depression. Denies irritability. He reports that he has been experiencing less risky behavior. He reports that excessive spending has improved some. He reports that he will periodically give friends money.  ? ?He reports that he has been feeling more tired in the morning and will drink coffee or an energy drink. Motivation has been better. Anxiety has been ok and recently went somewhere that he was not able to previously due to anxiety. 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 Sleeping ok and denies difficulty falling asleep. Denies change in appetite or weight. Concentration has been ok. Took a test at work and did well on this. Denies SI.  ? ?He continues to work at [employer]. Lives with parents.  ? ?He reports that he is using Kratom. "I'm not using as much as I used to."  ? ?Past Psychiatric Medication Trials: ?Seroquel ?Risperdal-Increased prolactin ?Abilify ?Paxil ?Effexor ?Citalopram ?Remeron- slurred speech, blurred vision, staggering ?Lamictal ?Valium ?Intuniv ?Vyvanse ?Trazodone ?  ?Review of Systems:  ?Review of Systems  ?Musculoskeletal:  Negative for gait problem.  ?Neurological:  Negative for tremors.  ?Psychiatric/Behavioral:    ?     Please refer to HPI  ? ?Medications: I have reviewed the patient's current medications. ? ?Current Outpatient Medications  ?Medication Sig Dispense Refill  ? PARoxetine (PAXIL) 30 MG tablet Take 1 tablet (30 mg total) by mouth at bedtime. 90 tablet 1  ? propranolol (INDERAL) 10 MG tablet Take 1 tablet (10 mg total) by mouth 2 (two) times daily as  needed. 60 tablet 3  ? ARIPiprazole (ABILIFY) 10 MG tablet Take 1 tablet (10 mg total) by mouth daily. 90 tablet 0  ? lamoTRIgine (LAMICTAL) 150 MG tablet Take 2 tablets (300 mg total) by mouth at bedtime. 180 tablet 1  ? ?No current facility-administered medications for this visit.  ? ? ?Medication Side Effects: None ? ?Allergies: No Known Allergies ? ?Past Medical History:  ?Diagnosis Date  ? Anxiety   ? Bipolar disorder (HCC)   ? Chronic kidney disease   ? kidney stone  ? Depression   ? Headache   ? Heart murmur 5 years ago  ? mother states she doesn't know name for heart condition, possible murmer  ? ? ?Past Medical History, Surgical history, Social history, and Family history were reviewed and updated as appropriate.  ? ?Please see review of systems for further details on the patient's review from today.  ? ?Objective:  ? ?Physical Exam:  ?BP 107/71   Pulse 78  ? ?Physical Exam ?Constitutional:   ?   General: He is not in acute distress. ?Musculoskeletal:     ?   General: No deformity.  ?Neurological:  ?   Mental Status: He is alert and oriented to person, place, and time.  ?   Coordination: Coordination normal.  ?Psychiatric:     ?   Attention and Perception: Attention and perception normal. He does not perceive auditory or visual hallucinations.     ?   Mood and Affect: Mood is not anxious or depressed.  Affect is not labile, blunt, angry or inappropriate.     ?   Speech: Speech normal.     ?   Behavior: Behavior normal.     ?   Thought Content: Thought content normal. Thought content is not paranoid or delusional. Thought content does not include homicidal or suicidal ideation. Thought content does not include homicidal or suicidal plan.     ?   Cognition and Memory: Cognition and memory normal.     ?   Judgment: Judgment normal.  ?   Comments: Insight intact  ? ? ?Lab Review:  ?   ?Component Value Date/Time  ? NA 131 (L) 05/02/2019 1341  ? K 3.5 05/02/2019 1341  ? CL 96 (L) 05/02/2019 1341  ? CO2 23  05/02/2019 1341  ? GLUCOSE 131 (H) 05/02/2019 1341  ? BUN 12 05/02/2019 1341  ? CREATININE 1.39 (H) 05/02/2019 1341  ? CALCIUM 9.7 05/02/2019 1341  ? PROT 6.2 08/15/2008 0600  ? ALBUMIN 4.1 08/15/2008 0600  ? AST 17 08/15/2008 0600  ? ALT 12 08/15/2008 0600  ? ALKPHOS 78 08/15/2008 0600  ? BILITOT 0.6 08/15/2008 0600  ? GFRNONAA >60 05/02/2019 1341  ? GFRAA >60 05/02/2019 1341  ? ? ?   ?Component Value Date/Time  ? WBC 8.4 05/02/2019 1341  ? RBC 5.22 05/02/2019 1341  ? HGB 15.9 05/02/2019 1341  ? HCT 47.5 05/02/2019 1341  ? PLT 291 05/02/2019 1341  ? MCV 91.0 05/02/2019 1341  ? MCH 30.5 05/02/2019 1341  ? MCHC 33.5 05/02/2019 1341  ? RDW 11.9 05/02/2019 1341  ? LYMPHSABS 2.0 09/05/2014 1255  ? MONOABS 1.3 (H) 09/05/2014 1255  ? EOSABS 0.1 09/05/2014 1255  ? BASOSABS 0.0 09/05/2014 1255  ? ? ?No results found for: POCLITH, LITHIUM  ? ?No results found for: PHENYTOIN, PHENOBARB, VALPROATE, CBMZ  ? ?.res ?Assessment: Plan:   ?Pt seen for 30 minutes and time spent discussing treatment options to include potential benefits, risks, and side effects of increasing Abilify to 15 mg po qd. Discussed potential benefits, risks, and side effects of Propranolol. Discussed that Propranolol can be used as needed for anxiety signs and symptoms. Patient agrees to trial of Propranolol. Will start Propranolol 10 mg 1 tab po BID prn anxiety.  ?Pt reports that he would prefer to start Propranolol prn for anxiety at this time and continue current dose of Abilify. ?Discussed taking Abilify earlier in the evening to minimize daytime somnolence.  ?Continue Lamictal 300 mg daily for mood s/s.  ?Continue Paxil 30 mg daily for mood and anxiety. ?He declines labs. He reports that he will consider establishing care with a new PCP. ?Recommend continuing psychotherapy with Elio Forget, [facility].   ?Pt to follow-up in 6 weeks or sooner if clinically indicated. ?Patient advised to contact office with any questions, adverse effects, or acute worsening  in signs and symptoms. ? ? ?Beren was seen today for anxiety and other. ? ?Diagnoses and all orders for this visit: ? ?Panic disorder ?-     propranolol (INDERAL) 10 MG tablet; Take 1 tablet (10 mg total) by mouth 2 (two) times daily as needed. ? ?Cyclothymic disorder ?-     lamoTRIgine (LAMICTAL) 150 MG tablet; Take 2 tablets (300 mg total) by mouth at bedtime. ?-     ARIPiprazole (ABILIFY) 10 MG tablet; Take 1 tablet (10 mg total) by mouth daily. ? ?Secondary neurodevelopmental disorder ?-     lamoTRIgine (LAMICTAL) 150 MG tablet; Take 2 tablets (300 mg  total) by mouth at bedtime. ? ?  ? ?Please see After Visit Summary for patient specific instructions. ? ?No future appointments. ? ?No orders of the defined types were placed in this encounter. ? ? ?------------------------------- ?

## 2021-12-01 ENCOUNTER — Other Ambulatory Visit: Payer: Self-pay | Admitting: Psychiatry

## 2021-12-01 DIAGNOSIS — F340 Cyclothymic disorder: Secondary | ICD-10-CM

## 2022-01-01 ENCOUNTER — Ambulatory Visit (INDEPENDENT_AMBULATORY_CARE_PROVIDER_SITE_OTHER): Payer: 59 | Admitting: Psychiatry

## 2022-01-01 ENCOUNTER — Encounter: Payer: Self-pay | Admitting: Psychiatry

## 2022-01-01 DIAGNOSIS — F411 Generalized anxiety disorder: Secondary | ICD-10-CM

## 2022-01-01 DIAGNOSIS — F902 Attention-deficit hyperactivity disorder, combined type: Secondary | ICD-10-CM | POA: Diagnosis not present

## 2022-01-01 DIAGNOSIS — F340 Cyclothymic disorder: Secondary | ICD-10-CM

## 2022-01-01 DIAGNOSIS — F410 Panic disorder [episodic paroxysmal anxiety] without agoraphobia: Secondary | ICD-10-CM

## 2022-01-01 DIAGNOSIS — F88 Other disorders of psychological development: Secondary | ICD-10-CM

## 2022-01-01 MED ORDER — ARIPIPRAZOLE 10 MG PO TABS: 10.0000 mg | ORAL_TABLET | Freq: Every day | ORAL | 0 refills | Status: DC

## 2022-01-01 MED ORDER — PROPRANOLOL HCL 10 MG PO TABS: ORAL_TABLET | ORAL | 1 refills | Status: DC

## 2022-01-01 MED ORDER — LAMOTRIGINE 150 MG PO TABS: 300.0000 mg | ORAL_TABLET | Freq: Every day | ORAL | 1 refills | Status: DC

## 2022-01-01 MED ORDER — PAROXETINE HCL 30 MG PO TABS: 30.0000 mg | ORAL_TABLET | Freq: Every day | ORAL | 1 refills | Status: DC

## 2022-01-01 NOTE — Progress Notes (Signed)
Pattricia Hayes ?[id number] ?1991-02-07 ?31 y.o. ? ?Subjective:  ? ?Patient ID:  Tony Hayes is a 31 y.o. (DOB Feb 24, 1991) male. ? ?Chief Complaint:  ?Chief Complaint  ?Patient presents with  ? Follow-up  ?  Mood disturbance and anxiety  ? ? ?HPI ?Tony Hayes presents to the office today for follow-up of mood disturbance and anxiety. He reports that anxiety has been "better" and that Propranolol has been helpful for anxiety. He denies any recent panic. He reports that his mood is occasionally down for brief periods after seeing happy couples and wishing he was in a relationship. Denies impulsivity or elevated moods. Denies difficulty falling asleep. He notices he has been hungry in the evenings. Denies weight gain. Motivation has been good. Energy has been ok. Concentration has been adequate. Denies SI.  ? ?He reports that he has been able to save some money.  ? ?He reports that he ran out of Propranolol and started using more Kratom. He reports that he was taking Propranolol about twice a day and was taking less Kratom at that time.  ? ?  ?Past Psychiatric Medication Trials: ?Seroquel ?Risperdal-Increased prolactin ?Abilify ?Paxil ?Effexor ?Citalopram ?Remeron- slurred speech, blurred vision, staggering ?Lamictal ?Valium ?Intuniv ?Vyvanse ?Trazodone ? ?Review of Systems:  ?Review of Systems  ?Musculoskeletal:  Negative for gait problem.  ?Neurological:  Negative for dizziness.  ?Psychiatric/Behavioral:    ?     Please refer to HPI  ? ?Medications: I have reviewed the patient's current medications. ? ?Current Outpatient Medications  ?Medication Sig Dispense Refill  ? propranolol (INDERAL) 10 MG tablet Take 1-2 tabs po BID prn anxiety 120 tablet 1  ? ARIPiprazole (ABILIFY) 10 MG tablet Take 1 tablet (10 mg total) by mouth daily. 90 tablet 0  ? lamoTRIgine (LAMICTAL) 150 MG tablet Take 2 tablets (300 mg total) by mouth at bedtime. 180 tablet 1  ? PARoxetine (PAXIL) 30 MG tablet Take 1 tablet (30 mg  total) by mouth at bedtime. 90 tablet 1  ? ?No current facility-administered medications for this visit.  ? ? ?Medication Side Effects: Other: Possible increased hunger at night ? ?Allergies: No Known Allergies ? ?Past Medical History:  ?Diagnosis Date  ? Anxiety   ? Bipolar disorder (HCC)   ? Chronic kidney disease   ? kidney stone  ? Depression   ? Headache   ? Heart murmur 5 years ago  ? mother states she doesn't know name for heart condition, possible murmer  ? ? ?Past Medical History, Surgical history, Social history, and Family history were reviewed and updated as appropriate.  ? ?Please see review of systems for further details on the patient's review from today.  ? ?Objective:  ? ?Physical Exam:  ?BP 122/60   Pulse 81   Wt 180 lb (81.6 kg)   BMI 28.19 kg/m?  ? ?Physical Exam ?Constitutional:   ?   General: He is not in acute distress. ?Musculoskeletal:     ?   General: No deformity.  ?Neurological:  ?   Mental Status: He is alert and oriented to person, place, and time.  ?   Coordination: Coordination normal.  ?Psychiatric:     ?   Attention and Perception: Attention and perception normal. He does not perceive auditory or visual hallucinations.     ?   Mood and Affect: Mood normal. Mood is not anxious or depressed. Affect is not labile, blunt, angry or inappropriate.     ?   Speech: Speech normal.     ?  Behavior: Behavior normal.     ?   Thought Content: Thought content normal. Thought content is not paranoid or delusional. Thought content does not include homicidal or suicidal ideation. Thought content does not include homicidal or suicidal plan.     ?   Cognition and Memory: Cognition and memory normal.     ?   Judgment: Judgment normal.  ?   Comments: Insight intact  ? ? ?Lab Review:  ?   ?Component Value Date/Time  ? NA 131 (L) 05/02/2019 1341  ? K 3.5 05/02/2019 1341  ? CL 96 (L) 05/02/2019 1341  ? CO2 23 05/02/2019 1341  ? GLUCOSE 131 (H) 05/02/2019 1341  ? BUN 12 05/02/2019 1341  ? CREATININE  1.39 (H) 05/02/2019 1341  ? CALCIUM 9.7 05/02/2019 1341  ? PROT 6.2 08/15/2008 0600  ? ALBUMIN 4.1 08/15/2008 0600  ? AST 17 08/15/2008 0600  ? ALT 12 08/15/2008 0600  ? ALKPHOS 78 08/15/2008 0600  ? BILITOT 0.6 08/15/2008 0600  ? GFRNONAA >60 05/02/2019 1341  ? GFRAA >60 05/02/2019 1341  ? ? ?   ?Component Value Date/Time  ? WBC 8.4 05/02/2019 1341  ? RBC 5.22 05/02/2019 1341  ? HGB 15.9 05/02/2019 1341  ? HCT 47.5 05/02/2019 1341  ? PLT 291 05/02/2019 1341  ? MCV 91.0 05/02/2019 1341  ? MCH 30.5 05/02/2019 1341  ? MCHC 33.5 05/02/2019 1341  ? RDW 11.9 05/02/2019 1341  ? LYMPHSABS 2.0 09/05/2014 1255  ? MONOABS 1.3 (H) 09/05/2014 1255  ? EOSABS 0.1 09/05/2014 1255  ? BASOSABS 0.0 09/05/2014 1255  ? ? ?No results found for: POCLITH, LITHIUM  ? ?No results found for: PHENYTOIN, PHENOBARB, VALPROATE, CBMZ  ? ?.res ?Assessment: Plan:   ? ?Pt seen for 30 minutes and time spent discussing his questions about possible increase in Propranolol and Kratom. Discussed that Propranolol could be increased to 10 mg 1-2 tabs po BID prn anxiety. Advised him to monitor for dizziness/light-headedness and to resume 10 mg dose if dizziness occurs with 20 mg. Discussed that Kratom has some opiate-like effects and stimulant-like effects and that withdrawal can occur after regular, long-time use. Recommended using Propranolol instead to help with anxiety at work.  ?Continue Paxil 30 mg daily for mood and anxiety.  ?Continue Abilify 10 mg po qd for mood stabilization.  ?Continue Lamictal 300 mg po qd for mood stabilization.  ?Recommend continuing psychotherapy with Elio Forget, [facility].  ?Pt to follow-up in 3 months or sooner if clinically indicated.  ?Patient advised to contact office with any questions, adverse effects, or acute worsening in signs and symptoms. ? ? ?Broady was seen today for follow-up. ? ?Diagnoses and all orders for this visit: ? ?Cyclothymic disorder ?-     ARIPiprazole (ABILIFY) 10 MG tablet; Take 1 tablet (10 mg  total) by mouth daily. ?-     lamoTRIgine (LAMICTAL) 150 MG tablet; Take 2 tablets (300 mg total) by mouth at bedtime. ? ?Generalized anxiety disorder ?-     PARoxetine (PAXIL) 30 MG tablet; Take 1 tablet (30 mg total) by mouth at bedtime. ? ?Panic disorder ?-     propranolol (INDERAL) 10 MG tablet; Take 1-2 tabs po BID prn anxiety ?-     PARoxetine (PAXIL) 30 MG tablet; Take 1 tablet (30 mg total) by mouth at bedtime. ? ?Attention deficit hyperactivity disorder (ADHD), combined type, mild ?-     PARoxetine (PAXIL) 30 MG tablet; Take 1 tablet (30 mg total) by mouth at bedtime. ? ?  Secondary neurodevelopmental disorder ?-     lamoTRIgine (LAMICTAL) 150 MG tablet; Take 2 tablets (300 mg total) by mouth at bedtime. ? ?  ? ?Please see After Visit Summary for patient specific instructions. ? ?Future Appointments  ?Date Time Provider Department Center  ?04/02/2022  9:00 AM Corie Chiquitoarter, Zulema Pulaski, PMHNP [facility] None  ? ? ?No orders of the defined types were placed in this encounter. ? ? ?------------------------------- ?

## 2022-01-11 ENCOUNTER — Telehealth: Payer: Self-pay | Admitting: Psychiatry

## 2022-01-11 DIAGNOSIS — F411 Generalized anxiety disorder: Secondary | ICD-10-CM

## 2022-01-11 DIAGNOSIS — F410 Panic disorder [episodic paroxysmal anxiety] without agoraphobia: Secondary | ICD-10-CM

## 2022-01-11 DIAGNOSIS — F88 Other disorders of psychological development: Secondary | ICD-10-CM

## 2022-01-11 DIAGNOSIS — F340 Cyclothymic disorder: Secondary | ICD-10-CM

## 2022-01-11 DIAGNOSIS — F902 Attention-deficit hyperactivity disorder, combined type: Secondary | ICD-10-CM

## 2022-01-11 MED ORDER — LAMOTRIGINE 150 MG PO TABS: 300.0000 mg | ORAL_TABLET | Freq: Every day | ORAL | 3 refills | Status: DC

## 2022-01-11 MED ORDER — PROPRANOLOL HCL 10 MG PO TABS: ORAL_TABLET | ORAL | 3 refills | Status: DC

## 2022-01-11 MED ORDER — ARIPIPRAZOLE 10 MG PO TABS: 10.0000 mg | ORAL_TABLET | Freq: Every day | ORAL | 3 refills | Status: DC

## 2022-01-11 MED ORDER — PAROXETINE HCL 30 MG PO TABS: 30.0000 mg | ORAL_TABLET | Freq: Every day | ORAL | 3 refills | Status: DC

## 2022-01-11 NOTE — Telephone Encounter (Signed)
Please let her know that additional refills were sent for him and that they can extended follow-up to a year from last apt if they prefer.  ?

## 2022-01-11 NOTE — Telephone Encounter (Signed)
Tony Hayes Pt's mom called concerned of pt having apt every 2-3 mos. He has high deductible and she has to pay. Marlyne Beards saw him 2 years unless issues. Asking to move apt out. Pt has learning disability and thinks he can find the perfect pill to make all this great. He will want to change something every visit.  Have ROI  ?

## 2022-01-12 NOTE — Telephone Encounter (Signed)
Notified mom- Hilda Blades. She wanted to schedule for every 6 mos. Advised of refills sent to [facility]. A[address] ?

## 2022-04-02 ENCOUNTER — Ambulatory Visit: Payer: 59 | Admitting: Psychiatry

## 2022-07-05 ENCOUNTER — Ambulatory Visit (INDEPENDENT_AMBULATORY_CARE_PROVIDER_SITE_OTHER): Payer: 59 | Admitting: Psychiatry

## 2022-07-05 ENCOUNTER — Encounter: Payer: Self-pay | Admitting: Psychiatry

## 2022-07-05 DIAGNOSIS — F902 Attention-deficit hyperactivity disorder, combined type: Secondary | ICD-10-CM

## 2022-07-05 DIAGNOSIS — F410 Panic disorder [episodic paroxysmal anxiety] without agoraphobia: Secondary | ICD-10-CM

## 2022-07-05 DIAGNOSIS — F340 Cyclothymic disorder: Secondary | ICD-10-CM

## 2022-07-05 DIAGNOSIS — F411 Generalized anxiety disorder: Secondary | ICD-10-CM

## 2022-07-05 DIAGNOSIS — F88 Other disorders of psychological development: Secondary | ICD-10-CM

## 2022-07-05 MED ORDER — PAROXETINE HCL 30 MG PO TABS: 30.0000 mg | ORAL_TABLET | Freq: Every day | ORAL | 1 refills | Status: DC

## 2022-07-05 MED ORDER — LAMOTRIGINE 150 MG PO TABS: 300.0000 mg | ORAL_TABLET | Freq: Every day | ORAL | 3 refills | Status: DC

## 2022-07-05 NOTE — Progress Notes (Signed)
Tony Hayes [id number] [birth year] 31 y.o.  Subjective:   Patient ID:  Tony Hayes is a 31 y.o. (DOB 1991/09/06) male.  Chief Complaint:  Chief Complaint  Patient presents with   Depression   Anxiety    HPI Tony Hayes presents to the office today for follow-up of anxiety and mood disturbance.   He reports that his work hours have been reduced to 2 days a week- "they're trying to get rid of me." He reports that he has reduced Kratom use to twice a week when he works. He reports that he no longer has health insurance.   He reports that his anxiety has been "a little bit better with certain things." He denies any recent panic attacks. He reports occ worry. He reports that he notices physical s/s of anxiety.   He reports that his mood has been "alright... maybe a little bit" of depression. He reports some irritability in response to parents "always being on my case." He denies elevated moods. Motivation and energy have been low when he is not working or looking for work. He reports that he has been spending more time in bed. He reports that his parents have commented that he is spending up to 3 days in bed on days off. He reports difficulty getting out of bed. He reports sleeping excessively and sleeping during the day. He reports increased appetite. Concentration is ok. Denies SI.   He reports occ substance use of stimulant meds. He reports that he will feel tired afterwards. He and his mother report that he has been using gummies and he reports that those may contain TSH.   Mother reports that he has "been down for several weeks and not very motivated." She reports that he stays in his bed "a lot" and staying out late at night. She reports that he is working less and this has affected his finances. He currently does not have health insurance. Mother reports "he doesn't take his medicine like he should." Mother is concerned about "risky behavior recently." She reports  that she has been asking others to loan him money.   Other reports that they are seeking evaluation for Central Auditory Processing Disorder. She reports that he often needs to refer back to written instructions. Mother reports that he had psychological testing at age 83 and evaluated by ST and psychologist late in high school. Mother reports that he needed help with testing in grade school and needed accommodations in high school.   Biological mother has h/o heroin dependence and bipolar disorder.   Past Psychiatric Medication Trials: Seroquel Risperdal-Increased prolactin Abilify Paxil Effexor Citalopram Remeron- slurred speech, blurred vision, staggering Wellbutrin Lamictal Valium Intuniv Vyvanse Trazodone Propranolol AIMS    Flowsheet Row Office Visit from 07/05/2022 in [facility] Total Score 1        Review of Systems:  Review of Systems  Musculoskeletal:  Negative for gait problem.  Neurological:  Negative for tremors.  Psychiatric/Behavioral:         Please refer to HPI    Medications: I have reviewed the patient's current medications.  Current Outpatient Medications  Medication Sig Dispense Refill   cariprazine (VRAYLAR) 1.5 MG capsule Take 1 capsule (1.5 mg total) by mouth daily. 35 capsule 0   lamoTRIgine (LAMICTAL) 150 MG tablet Take 2 tablets (300 mg total) by mouth at bedtime. 180 tablet 3   PARoxetine (PAXIL) 30 MG tablet Take 1 tablet (30 mg total) by mouth at bedtime.  90 tablet 1   propranolol (INDERAL) 10 MG tablet Take 1-2 tabs po BID prn anxiety (Patient not taking: Reported on 07/05/2022) 120 tablet 3   No current facility-administered medications for this visit.    Medication Side Effects: Other: occ leg twitches  Allergies: No Known Allergies  Past Medical History:  Diagnosis Date   Anxiety    Bipolar disorder ([place])    Chronic kidney disease    kidney stone   Depression    Headache    Heart murmur 5 years ago    mother states she doesn't know name for heart condition, possible murmer    Past Medical History, Surgical history, Social history, and Family history were reviewed and updated as appropriate.   Please see review of systems for further details on the patient's review from today.   Objective:   Physical Exam:  There were no vitals taken for this visit.  Physical Exam Constitutional:      General: He is not in acute distress. Musculoskeletal:        General: No deformity.  Neurological:     Mental Status: He is alert and oriented to person, place, and time.     Coordination: Coordination normal.  Psychiatric:        Attention and Perception: Attention and perception normal. He does not perceive auditory or visual hallucinations.        Mood and Affect: Mood is depressed. Mood is not anxious. Affect is not labile, blunt, angry or inappropriate.        Speech: Speech normal.        Behavior: Behavior is slowed.        Thought Content: Thought content normal. Thought content is not paranoid or delusional. Thought content does not include homicidal or suicidal ideation. Thought content does not include homicidal or suicidal plan.        Cognition and Memory: Cognition and memory normal.        Judgment: Judgment normal.     Comments: Insight intact     Lab Review:     Component Value Date/Time   NA 131 (L) 05/02/2019 1341   K 3.5 05/02/2019 1341   CL 96 (L) 05/02/2019 1341   CO2 23 05/02/2019 1341   GLUCOSE 131 (H) 05/02/2019 1341   BUN 12 05/02/2019 1341   CREATININE 1.39 (H) 05/02/2019 1341   CALCIUM 9.7 05/02/2019 1341   PROT 6.2 08/15/2008 0600   ALBUMIN 4.1 08/15/2008 0600   AST 17 08/15/2008 0600   ALT 12 08/15/2008 0600   ALKPHOS 78 08/15/2008 0600   BILITOT 0.6 08/15/2008 0600   GFRNONAA >60 05/02/2019 1341   GFRAA >60 05/02/2019 1341       Component Value Date/Time   WBC 8.4 05/02/2019 1341   RBC 5.22 05/02/2019 1341   HGB 15.9 05/02/2019 1341   HCT 47.5  05/02/2019 1341   PLT 291 05/02/2019 1341   MCV 91.0 05/02/2019 1341   MCH 30.5 05/02/2019 1341   MCHC 33.5 05/02/2019 1341   RDW 11.9 05/02/2019 1341   LYMPHSABS 2.0 09/05/2014 1255   MONOABS 1.3 (H) 09/05/2014 1255   EOSABS 0.1 09/05/2014 1255   BASOSABS 0.0 09/05/2014 1255    No results found for: "POCLITH", "LITHIUM"   No results found for: "PHENYTOIN", "PHENOBARB", "VALPROATE", "CBMZ"   .res Assessment: Plan:    Pt seen individually and then mother joins during last part of exam at their request. Discussed treatment history with mother and discussed both pt  and mother's current concerns about his depression. Discussed potential benefits, risks, and side effects of Vraylar to include reviewing potential risks of atypical antipsychotics (TD, metabolic side effects). Discussed that Arman Filter has low risk of weight gain or fatigue, which he has experienced with other atypical antipsychotics in the past. Discussed that he may be eligible for patient assistance since he does not have health insurance at this time.  Pt agrees to trial of Vraylar 1.5 mg po qd.  Continue Lamictal 300 mg po qd for mood stabilization.  Continue Paxil 30 mg po qd for anxiety and depression.  Continue Propranolol prn anxiety.  Pt to follow-up in 6 months or sooner if clinically indicated.  Patient advised to contact office with any questions, adverse effects, or acute worsening in signs and symptoms.   Caffrey was seen today for depression and anxiety.  Diagnoses and all orders for this visit:  Cyclothymic disorder -     lamoTRIgine (LAMICTAL) 150 MG tablet; Take 2 tablets (300 mg total) by mouth at bedtime. -     cariprazine (VRAYLAR) 1.5 MG capsule; Take 1 capsule (1.5 mg total) by mouth daily.  Secondary neurodevelopmental disorder -     lamoTRIgine (LAMICTAL) 150 MG tablet; Take 2 tablets (300 mg total) by mouth at bedtime.  Generalized anxiety disorder -     PARoxetine (PAXIL) 30 MG tablet; Take 1  tablet (30 mg total) by mouth at bedtime.  Panic disorder -     PARoxetine (PAXIL) 30 MG tablet; Take 1 tablet (30 mg total) by mouth at bedtime.  Attention deficit hyperactivity disorder (ADHD), combined type, mild -     PARoxetine (PAXIL) 30 MG tablet; Take 1 tablet (30 mg total) by mouth at bedtime.     Please see After Visit Summary for patient specific instructions.  Future Appointments  Date Time Provider [place]  01/03/2023  1:00 PM Thayer Headings, PMHNP [facility] None    No orders of the defined types were placed in this encounter.   -------------------------------

## 2022-07-06 MED ORDER — CARIPRAZINE HCL 1.5 MG PO CAPS: 1.5000 mg | ORAL_CAPSULE | Freq: Every day | ORAL | 0 refills | Status: DC

## 2023-01-03 ENCOUNTER — Ambulatory Visit (INDEPENDENT_AMBULATORY_CARE_PROVIDER_SITE_OTHER): Payer: Self-pay | Admitting: Psychiatry

## 2023-01-03 ENCOUNTER — Encounter: Payer: Self-pay | Admitting: Psychiatry

## 2023-01-03 DIAGNOSIS — F340 Cyclothymic disorder: Secondary | ICD-10-CM

## 2023-01-03 DIAGNOSIS — F410 Panic disorder [episodic paroxysmal anxiety] without agoraphobia: Secondary | ICD-10-CM

## 2023-01-03 DIAGNOSIS — F411 Generalized anxiety disorder: Secondary | ICD-10-CM

## 2023-01-03 DIAGNOSIS — F88 Other disorders of psychological development: Secondary | ICD-10-CM

## 2023-01-03 DIAGNOSIS — F902 Attention-deficit hyperactivity disorder, combined type: Secondary | ICD-10-CM

## 2023-01-03 MED ORDER — PAROXETINE HCL 30 MG PO TABS: 30.0000 mg | ORAL_TABLET | Freq: Every day | ORAL | 1 refills | Status: DC

## 2023-01-03 MED ORDER — LAMOTRIGINE 150 MG PO TABS: 300.0000 mg | ORAL_TABLET | Freq: Every day | ORAL | 3 refills | Status: DC

## 2023-01-03 MED ORDER — CARIPRAZINE HCL 1.5 MG PO CAPS: 1.5000 mg | ORAL_CAPSULE | Freq: Every day | ORAL | 0 refills | Status: DC

## 2023-01-03 NOTE — Progress Notes (Unsigned)
Tony Hayes [id number] 07-18-1991 31 y.o.  Subjective:   Patient ID:  Tony Hayes is a 32 y.o. (DOB 1991/01/06) male.  Chief Complaint: No chief complaint on file.   HPI Tony Hayes presents to the office today for follow-up of mood disturbance, anxiety, and h/o substance use.   He reports, "just a little bit better." He reports that his parents found out about his substance use about a month ago. He reports that they were drug testing him. He reports that he went through some withdrawals. Continues to have some cocaine cravings, particularly at night. He reports anxiety has been "alright." Increased anxiety with cravings. He reports that he has some depression. Energy and motivation are "there, it's not an extreme amount" and somewhat low. He reports that he has an altered sleep pattern with sleeping more or less. Difficulty with focus and concentration at times. Denies SI.   He reports that he has been gaining some weight and has increased hunger. Reports that this coincides with stopping substance use.   Has a new job at the [employer]. He reports that new job is less stressful. Working part-time.   He has been going to [facility] weekly.   Mother reports that he had an evaluation last week at [facility] to evaluate for auditory processing disorder.   Past Psychiatric Medication Trials: Seroquel Risperdal-Increased prolactin Abilify Paxil Effexor Citalopram Remeron- slurred speech, blurred vision, staggering Wellbutrin Lamictal Valium Intuniv Vyvanse Trazodone Propranolol  AIMS    Flowsheet Row Office Visit from 07/05/2022 in [facility]  AIMS Total Score 1        Review of Systems:  Review of Systems  Constitutional:  Negative for diaphoresis.  Gastrointestinal: Negative.   Musculoskeletal:  Negative for gait problem.  Neurological:  Negative for tremors.  Psychiatric/Behavioral:         Please refer  to HPI    Medications: {medication reviewed/display:3041432}  Current Outpatient Medications  Medication Sig Dispense Refill   cariprazine (VRAYLAR) 1.5 MG capsule Take 1 capsule (1.5 mg total) by mouth daily. 35 capsule 0   lamoTRIgine (LAMICTAL) 150 MG tablet Take 2 tablets (300 mg total) by mouth at bedtime. 180 tablet 3   PARoxetine (PAXIL) 30 MG tablet Take 1 tablet (30 mg total) by mouth at bedtime. 90 tablet 1   propranolol (INDERAL) 10 MG tablet Take 1-2 tabs po BID prn anxiety (Patient not taking: Reported on 07/05/2022) 120 tablet 3   No current facility-administered medications for this visit.    Medication Side Effects: {Medication Side Effects (Optional):21014029}  Allergies: No Known Allergies  Past Medical History:  Diagnosis Date   Anxiety    Bipolar disorder ([facility])    Chronic kidney disease    kidney stone   Depression    Headache    Heart murmur 5 years ago   mother states she doesn't know name for heart condition, possible murmer    Past Medical History, Surgical history, Social history, and Family history were reviewed and updated as appropriate.   Please see review of systems for further details on the patient's review from today.   Objective:   Physical Exam:  There were no vitals taken for this visit.  Physical Exam  Lab Review:     Component Value Date/Time   NA 131 (L) 05/02/2019 1341   K 3.5 05/02/2019 1341   CL 96 (L) 05/02/2019 1341   CO2 23 05/02/2019 1341   GLUCOSE 131 (H) 05/02/2019  1341   BUN 12 05/02/2019 1341   CREATININE 1.39 (H) 05/02/2019 1341   CALCIUM 9.7 05/02/2019 1341   PROT 6.2 08/15/2008 0600   ALBUMIN 4.1 08/15/2008 0600   AST 17 08/15/2008 0600   ALT 12 08/15/2008 0600   ALKPHOS 78 08/15/2008 0600   BILITOT 0.6 08/15/2008 0600   GFRNONAA >60 05/02/2019 1341   GFRAA >60 05/02/2019 1341       Component Value Date/Time   WBC 8.4 05/02/2019 1341   RBC 5.22 05/02/2019 1341   HGB 15.9 05/02/2019 1341   HCT 47.5  05/02/2019 1341   PLT 291 05/02/2019 1341   MCV 91.0 05/02/2019 1341   MCH 30.5 05/02/2019 1341   MCHC 33.5 05/02/2019 1341   RDW 11.9 05/02/2019 1341   LYMPHSABS 2.0 09/05/2014 1255   MONOABS 1.3 (H) 09/05/2014 1255   EOSABS 0.1 09/05/2014 1255   BASOSABS 0.0 09/05/2014 1255    No results found for: "POCLITH", "LITHIUM"   No results found for: "PHENYTOIN", "PHENOBARB", "VALPROATE", "CBMZ"   .res Assessment: Plan:    There are no diagnoses linked to this encounter.   Please see After Visit Summary for patient specific instructions.  Future Appointments  Date Time Provider Department [facility]  01/03/2023  1:00 PM Corie Chiquito, PMHNP [facility] None    No orders of the defined types were placed in this encounter.   -------------------------------

## 2023-01-13 ENCOUNTER — Telehealth: Payer: Self-pay | Admitting: Psychiatry

## 2023-01-13 NOTE — Telephone Encounter (Signed)
She said pt has been taking samples of Vraylar for about a week.  He is not liking the side effects which are increased appetite and weight gain, tired, slow to move and sleepiness.  He just started a new job where he's trying to learn and the sleepiness is a problem.  Pls call mom back at [phone] as she is home all day.  Next appt 8/26

## 2023-01-13 NOTE — Telephone Encounter (Signed)
Told mom that patient should try for another week, that sx tend to resolve with continued use, but if unable to tolerate to let us know.

## 2023-01-13 NOTE — Telephone Encounter (Signed)
Pt's mom called at 10:19a.  She is on DPR.

## 2023-01-17 ENCOUNTER — Telehealth: Payer: Self-pay | Admitting: Psychiatry

## 2023-01-17 NOTE — Telephone Encounter (Addendum)
Tony Hayes gave verbal permission to discuss with his mother. Mom said patient only took the Vraylar for a week and he was c/o of increased appetite and weight gain. Family reads info online and are concerned also about possible long-term effects, as well as cost of a brand medication. They don't want him to be on any antipsychotic, don't feel like he has a diagnosis that warrants it. Patient does not consistently have insurance and mom said she and dad are aging and concerned about him being able to apply for patient assistance if they aren't around. Currently utilizing [facility] for meds. Mom is asking if patient should go back on Abilify and at what dose. Mom feels like patient did receive benefit from Abilify, even if patient didn't. Mom not sure patient is a good historian with regards to sx and benefits of medications. Patient had asked that I discuss medications with mother instead of him.   F/U 8/26. ? If he should be scheduled for an earlier appt to discuss medications.

## 2023-01-17 NOTE — Telephone Encounter (Signed)
Understand their concerns about Vraylar and antipsychotics. Vraylar can be stopped without having to taper due to long half-life and it taking several weeks to completely wash out. Agree with mother that he did seem to benefit some from Abilify. Ok to resume Abilify if they are comfortable with side effects and risks of an atypical antipsychotic. He could re-start Abilify 10 mg 1/2 tablet daily for one week, then increase to previous dose of 1 tablet daily. If they are uncomfortable with him taking any atypical antipsychotics, it would be ok to stop Abilify and Vraylar and see how things go with taking only Paxil and Lamictal. Atypical antipsychotics have been prescribed to help with mood symptoms and impulsivity, and not psychosis.

## 2023-01-17 NOTE — Telephone Encounter (Signed)
Mom lvm that  she would like to talk to nurse about his medications. They want to stop the vraylar and taper of abilify. The dpr in the chart says that we can only speak to mom about appointments and financial. So I wouild call kyle back at [phone]

## 2023-01-18 NOTE — Telephone Encounter (Signed)
They can stop Abilify without having to taper dose since it takes several weeks to completely wash out. Are they ok with continuing Paxil and Lamictal? Both of those medications would need to be gradually reduced if they were wanting to stop them.

## 2023-01-18 NOTE — Telephone Encounter (Signed)
Pt is scheduled  for 03/07/23

## 2023-01-18 NOTE — Telephone Encounter (Signed)
Please call mom to schedule an earlier appt with Shanda Bumps.   Alfonso Patten [phone]

## 2023-01-18 NOTE — Telephone Encounter (Signed)
Mom informed of recommendations. She wants patient to wean off Abilify also and wants to know how to do that. She wants to use natural supplements. Have asked admin to schedule an earlier appt to discuss medication concerns.

## 2023-01-18 NOTE — Addendum Note (Signed)
Addended by: Derenda Mis on: 01/18/2023 02:40 PM   Modules accepted: Orders

## 2023-01-19 NOTE — Telephone Encounter (Signed)
Mom notified of recommendations. Patient wants to come off all medications but mom said he definitely needs to be on some medications, just not antipsychotics. She is okay to stay on Paxil and Lamictal.

## 2023-03-07 ENCOUNTER — Encounter: Payer: Self-pay | Admitting: Psychiatry

## 2023-03-07 ENCOUNTER — Ambulatory Visit (INDEPENDENT_AMBULATORY_CARE_PROVIDER_SITE_OTHER): Payer: Self-pay | Admitting: Psychiatry

## 2023-03-07 DIAGNOSIS — F410 Panic disorder [episodic paroxysmal anxiety] without agoraphobia: Secondary | ICD-10-CM

## 2023-03-07 DIAGNOSIS — F411 Generalized anxiety disorder: Secondary | ICD-10-CM

## 2023-03-07 DIAGNOSIS — H9325 Central auditory processing disorder: Secondary | ICD-10-CM

## 2023-03-07 DIAGNOSIS — F902 Attention-deficit hyperactivity disorder, combined type: Secondary | ICD-10-CM

## 2023-03-07 MED ORDER — PAROXETINE HCL 30 MG PO TABS: 30.0000 mg | ORAL_TABLET | Freq: Every day | ORAL | 1 refills | Status: DC
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
## 2023-03-29 ENCOUNTER — Ambulatory Visit (INDEPENDENT_AMBULATORY_CARE_PROVIDER_SITE_OTHER): Payer: Self-pay | Admitting: Mental Health

## 2023-03-29 DIAGNOSIS — F411 Generalized anxiety disorder: Secondary | ICD-10-CM

## 2023-03-29 NOTE — Progress Notes (Signed)
      [facility] Counselor/[facility]rapist Progress Note  Patient ID: Tony Hayes, MRN: [id number],    Date: 03/29/2023  Time Spent: 52 minutes   Treatment Type: Individual Therapy  Reported Symptoms: Depressed mood, anhedonia, anxiety    Mental Status Exam:    Appearance:    Casual     Behavior:   Appropriate  Motor:   WNL  Speech/Language:    Clear and Coherent  Affect:   Full range   Mood:   Depressed, pleasant  Thought process:   normal  Thought content:     WNL  Sensory/Perceptual disturbances:     none  Orientation:   x4  Attention:   Good  Concentration:   Good  Memory:   Intact  Fund of knowledge:    Consistent with age and development  Insight:     Good  Judgment:    Good  Impulse Control:   Good     Risk Assessment: Danger to Self:  No Self-injurious Behavior: No Danger to Others: No Duty to Warn:no Physical Aggression / Violence:No  Access to Firearms a concern: No  Gang Involvement:No   Subjective: Patient presents on time for today's session.atient said progress since last session which is approximity 2 years ago. You stand for he's had problems with substance use, as recently cocaine where his last use was about 2 weeks ago. Prior to that he stated that his use was sporadic. He expressed wanting to continue to discontinue his use. You say that he spends most of his days lying around after losing his full-time job about a month and a half ago. He stated this was very discouraging as he felt this was going to be a job he would keep for years. He said that he had the job for about 4 months. The express motivation to change, specifically wanting to start working out again, eat healthy as I said he's gained weight. He said he's 32 years old now and does not want to be living with his parents and be in this situation.  Explored ways to begin making changes.  Provide some psychoeducation related to creating habits, encouraging him to make daily less and begin to  use a calendar.  Interventions: Cognitive Behavioral Therapy, Ego-Supportive, and Insight-Oriented  Diagnosis:   ICD-10-CM   1. Generalized anxiety disorder  F41.1          Plan: Patient to utilize coping skills as discussed, utilize his support system, continue to take steps toward health and wellness by continuing his exercise regimen.   Long-term goal:   Reduce overall level, frequency, and intensity anxiety and panic per client report for at least 3 consecutive months.  Short-term goal:  Identifying specific situations/setting that leads to increased feelings of anxiety and panic Verbally express understanding of the relationship between feelings of depression, anxiety and their impact on thinking patterns and behaviors. Verbalize an understanding of the role that distorted thinking plays in creating fears, excessive worry, and ruminations.    [name] Session, [facility]

## 2023-05-09 ENCOUNTER — Ambulatory Visit: Payer: Self-pay | Admitting: Psychiatry

## 2023-05-10 ENCOUNTER — Encounter: Payer: Self-pay | Admitting: Family Medicine

## 2023-05-10 ENCOUNTER — Ambulatory Visit: Payer: Medicaid Other | Admitting: Family Medicine

## 2023-05-10 VITALS — BP 118/78 | Temp 97.3°F | Ht 70.0 in | Wt 249.2 lb

## 2023-05-10 DIAGNOSIS — Z79899 Other long term (current) drug therapy: Secondary | ICD-10-CM | POA: Diagnosis not present

## 2023-05-10 DIAGNOSIS — E66811 Obesity, class 1: Secondary | ICD-10-CM | POA: Insufficient documentation

## 2023-05-10 DIAGNOSIS — E6609 Other obesity due to excess calories: Secondary | ICD-10-CM | POA: Insufficient documentation

## 2023-05-10 DIAGNOSIS — H9325 Central auditory processing disorder: Secondary | ICD-10-CM | POA: Diagnosis not present

## 2023-05-10 DIAGNOSIS — F340 Cyclothymic disorder: Secondary | ICD-10-CM

## 2023-05-10 DIAGNOSIS — H6123 Impacted cerumen, bilateral: Secondary | ICD-10-CM

## 2023-05-10 DIAGNOSIS — Z23 Encounter for immunization: Secondary | ICD-10-CM

## 2023-05-10 DIAGNOSIS — Z87442 Personal history of urinary calculi: Secondary | ICD-10-CM | POA: Insufficient documentation

## 2023-05-10 LAB — COMPREHENSIVE METABOLIC PANEL
ALT: 40 U/L (ref 0–53)
AST: 24 U/L (ref 0–37)
Albumin: 4.6 g/dL (ref 3.5–5.2)
Alkaline Phosphatase: 55 U/L (ref 39–117)
BUN: 13 mg/dL (ref 6–23)
CO2: 27 mEq/L (ref 19–32)
Calcium: 10.1 mg/dL (ref 8.4–10.5)
Chloride: 100 mEq/L (ref 96–112)
Creatinine, Ser: 1.3 mg/dL (ref 0.40–1.50)
GFR: 73.55 mL/min (ref >60.00)
Glucose, Bld: 90 mg/dL (ref 70–99)
Potassium: 4 mEq/L (ref 3.5–5.1)
Sodium: 137 mEq/L (ref 135–145)
Total Bilirubin: 0.5 mg/dL (ref 0.2–1.2)
Total Protein: 7.1 g/dL (ref 6.0–8.3)

## 2023-05-10 LAB — HEMOGLOBIN A1C: Hgb A1c MFr Bld: 5.6 % (ref 4.6–6.5)

## 2023-05-10 LAB — LIPID PANEL
Cholesterol: 202 mg/dL — ABNORMAL HIGH (ref 0–200)
HDL: 51.5 mg/dL (ref >39.00)
NonHDL: 150.1
Total CHOL/HDL Ratio: 4
Triglycerides: 224 mg/dL — ABNORMAL HIGH (ref 0.0–149.0)
VLDL: 44.8 mg/dL — ABNORMAL HIGH (ref 0.0–40.0)

## 2023-05-10 LAB — LDL CHOLESTEROL, DIRECT: Direct LDL: 141 mg/dL

## 2023-05-10 NOTE — Assessment & Plan Note (Signed)
Impacting employability.

## 2023-05-10 NOTE — Addendum Note (Signed)
Addended by: Waymond Cera on: 05/10/2023 11:36 AM   Modules accepted: Orders

## 2023-05-10 NOTE — Progress Notes (Signed)
[facility] [address] [place] [address] Dept: [phone] Dept Fax: [phone]  New Patient Office Visit  Subjective:    Patient ID: Tony Hayes, male    DOB: 1991-03-15, 32 y.o..   MRN: [id number]  Chief Complaint  Patient presents with   Establish Care    NP- establish care.  Needs referral to for eye doctor.     History of Present Illness:  Patient is in today to establish care. Tony Hayes was born in [place]. He was adopted at a few days of age and knows very little about his birth family. He attended [employer] for a year, studying recording/engineering. however, he has a history of learning disabilities, ADHD, and auditory processing issues that have caused him to lose multiple jobs. His family is currently helping him apply for disability. He currently lives with his parents. He is single nad has no children. He recently stopped vaping nicotine. He denies current alcohol or drug abuse.  Tony Hayes  has a history of multiple behavioral health issues, including cyclothymia, generalized anxiety, and panic disorder. he is currently managed on lamotrigine 150 mg daily and paroxetine 30 mg daily. He seems Tony Hayes for management.   Tony Hayes recently saw an audiologist who noted impacted wax in his ear canals.  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   Past Surgical History:  Procedure Laterality Date   CYSTOSCOPY WITH RETROGRADE  PYELOGRAM, URETEROSCOPY AND STENT PLACEMENT Right 09/05/2014   Procedure: CYSTOSCOPY WITH RIGHT RETROGRADE PYELOGRAM AND STENT PLACEMENT;  Surgeon: Tony Ludwig, MD;  Location: [facility];  Service: Urology;  Laterality: Right;   CYSTOSCOPY WITH RETROGRADE PYELOGRAM, URETEROSCOPY AND STENT PLACEMENT Right 10/21/2014   Procedure: CYSTOSCOPY WITH RIGHT RETROGRADE PYELOGRAM, RIGHT URETEROSCOPY AND  STONE EXTRACTION ,STENT PLACEMENT;  Surgeon: Tony Ludwig, MD;  Location: [facility];  Service: Urology;  Laterality: Right;   HOLMIUM LASER APPLICATION Right 10/21/2014   Procedure: HOLMIUM LASER ;  Surgeon: Tony Ludwig, MD;  Location: [facility];  Service: Urology;  Laterality: Right;   ORCHIOPEXY     32 year of age   WISDOM TOOTH EXTRACTION     all removed at age 78   Family History  Adopted: Yes  Problem Relation Age of Onset   Drug abuse Mother    Outpatient Medications Prior to Visit  Medication Sig Dispense Refill   Cholecalciferol (VITAMIN D3) 50 MCG (2000 UT) CAPS Take by mouth.     lamoTRIgine (LAMICTAL) 150 MG tablet Take 2 tablets (300 mg total) by mouth at bedtime. 180 tablet 3   MAGNESIUM PO Take by mouth at bedtime as needed.     Multiple Vitamin (MULTIVITAMIN) tablet Take 1 tablet by mouth daily.     PARoxetine (PAXIL) 30 MG tablet Take 1 tablet (30 mg total) by mouth at bedtime. 90 tablet 1   No facility-administered medications prior to visit.   No Known Allergies Objective:   Today's Vitals   05/10/23 0955  BP: 118/78  Temp: (!) 97.3 F (36.3 C)  TempSrc: Temporal  Weight: 249 lb  3.2 oz (113 kg)  Height: 5\' 10"  (1.778 m)   Body mass index is 35.76 kg/m.   General: Well developed, well nourished. No acute distress. HEENT: Normocephalic, non-traumatic. External ears normal. EAC with impacted wax on the right. and some wax present on the left. CV: RRR without murmurs or rubs. Pulses 2+ bilaterally. Psych: Alert and oriented. Normal mood and  affect.  Health Maintenance Due  Topic Date Due   HIV Screening  Never done   Hepatitis C Screening  Never done   HPV VACCINES (2 - Male 3-dose series) 02/23/2012   INFLUENZA VACCINE  04/14/2023   PROCEDURE- Ear Wax Removal Indication: Impacted ear wax  PARQ reviewed with patient. Verbal consent obtained. Flushed both ears with mixture of warm water and hydrogen peroxide. Lighted curette used to remove wax from the right ear. Patient tolerated procedure well.    Assessment & Plan:   Problem List Items Addressed This Visit       Other   Central auditory processing disorder    Impacting employability.      Cyclothymic disorder - Primary    Continue lamotrigine 150 mg daily and paroxetine 30 mg daily. Continue to follow with Tony Hayes.      Other Visit Diagnoses     High risk medication use       Recommend screenign for metabolic complicaitons, as had been on Abilify until recently.   Relevant Orders   Comprehensive metabolic panel   Hemoglobin A1c   Lipid panel   Bilateral impacted cerumen       Ears flushed as noted above.       Return for Annual preventative care.   Tony Mast, MD

## 2023-05-10 NOTE — Assessment & Plan Note (Signed)
Continue lamotrigine 150 mg daily and paroxetine 30 mg daily. Continue to follow with Ms. Montez Morita.

## 2023-05-12 ENCOUNTER — Ambulatory Visit: Payer: Medicaid Other | Admitting: Mental Health

## 2023-05-12 DIAGNOSIS — F411 Generalized anxiety disorder: Secondary | ICD-10-CM | POA: Diagnosis not present

## 2023-05-12 NOTE — Progress Notes (Signed)
      [facility] Counselor/Therapist Progress Note  Patient ID: Tony Hayes, MRN: [id number],    Date: 05/12/2023  Time Spent: 51 minutes   Treatment Type: Individual Therapy  Reported Symptoms: Depressed mood, anhedonia, anxiety    Mental Status Exam:    Appearance:    Casual     Behavior:   Appropriate  Motor:   WNL  Speech/Language:    Clear and Coherent  Affect:   Full range   Mood:   Depressed, pleasant  Thought process:   normal  Thought content:     WNL  Sensory/Perceptual disturbances:     none  Orientation:   x4  Attention:   Good  Concentration:   Good  Memory:   Intact  Fund of knowledge:    Consistent with age and development  Insight:     Good  Judgment:    Good  Impulse Control:   Good     Risk Assessment: Danger to Self:  No Self-injurious Behavior: No Danger to Others: No Duty to Warn:no Physical Aggression / Violence:No  Access to Firearms a concern: No  Gang Involvement:No   Subjective: Patient presents on time for today's session.  Assess progress where patient stated that he has been sober for the past month and a half.  He stated that he plans to continue and expressed motivation and positive changes.  He stated that he has had more energy, has had less isolated behaviors, not staying in his room as he was.  He expressed regret about how much time he feels he has "wasted".  Patient was encouraged to recognize and embrace in his life decisions, encouraging him to consider what he has learned about himself.  He stated that he wants to remain sober, recognizing the family history of biologically of addiction.  He shared how he continues to work through feelings of abandonment related to his biological family, being adopted as an infant by his parents.  Recognizing both his biological mother and father's history of addiction.  Patient identified wanting to continue to work on increasing his self-confidence, be okay with "myself". Continue to  encourage self-care where patient plans to continue to not isolate in his room, spend time with his parents, began working out again and prioritize his sleep regimen.   Interventions: Cognitive Behavioral Therapy, Ego-Supportive, and Insight-Oriented  Diagnosis:   ICD-10-CM   1. Generalized anxiety disorder  F41.1           Plan: Patient to utilize coping skills as discussed, utilize his support system, continue to take steps toward health and wellness by continuing his exercise regimen.   Long-term goal:   Reduce overall level, frequency, and intensity anxiety and panic per client report for at least 3 consecutive months.  Short-term goal:  Identifying specific situations/setting that leads to increased feelings of anxiety and panic Verbally express understanding of the relationship between feelings of depression, anxiety and their impact on thinking patterns and behaviors. Verbalize an understanding of the role that distorted thinking plays in creating fears, excessive worry, and ruminations.  Increase confidence, feelings of higher self-worth   [name] Session, [facility]

## 2023-05-20 DIAGNOSIS — Z23 Encounter for immunization: Secondary | ICD-10-CM | POA: Diagnosis not present

## 2023-05-20 NOTE — Addendum Note (Signed)
Addended by: Waymond Cera on: 05/20/2023 10:38 AM   Modules accepted: Orders

## 2023-05-24 DIAGNOSIS — H5213 Myopia, bilateral: Secondary | ICD-10-CM | POA: Diagnosis not present

## 2023-05-25 ENCOUNTER — Encounter: Payer: Self-pay | Admitting: Family Medicine

## 2023-06-02 ENCOUNTER — Telehealth: Payer: Self-pay | Admitting: Family Medicine

## 2023-06-02 NOTE — Telephone Encounter (Signed)
Patients mother, Stanton Kidney notified and no further questions. Dm/cma

## 2023-06-02 NOTE — Telephone Encounter (Signed)
Pts mother called to say that they would like for you to call her and give the lab results. She is on his DPR.   Tony Hayes  [phone]

## 2023-06-15 ENCOUNTER — Ambulatory Visit: Payer: Medicaid Other | Admitting: Mental Health

## 2023-07-15 ENCOUNTER — Ambulatory Visit (INDEPENDENT_AMBULATORY_CARE_PROVIDER_SITE_OTHER): Payer: Medicaid Other | Admitting: Mental Health

## 2023-07-15 DIAGNOSIS — F411 Generalized anxiety disorder: Secondary | ICD-10-CM

## 2023-07-15 NOTE — Progress Notes (Signed)
      [facility] Counselor/Therapist Progress Note  Patient ID: Tony Hayes, MRN: [id number],    Date: 07/15/23  Time Spent: 50 minutes   Treatment Type: Individual Therapy  Reported Symptoms: Depressed mood, anhedonia, anxiety    Mental Status Exam:    Appearance:    Casual     Behavior:   Appropriate  Motor:   WNL  Speech/Language:    Clear and Coherent  Affect:   Full range   Mood:   Depressed, pleasant  Thought process:   normal  Thought content:     WNL  Sensory/Perceptual disturbances:     none  Orientation:   x4  Attention:   Good  Concentration:   Good  Memory:   Intact  Fund of knowledge:    Consistent with age and development  Insight:     Good  Judgment:    Good  Impulse Control:   Good     Risk Assessment: Danger to Self:  No Self-injurious Behavior: No Danger to Others: No Duty to Warn:no Physical Aggression / Violence:No  Access to Firearms a concern: No  Gang Involvement:No   Subjective: Patient presents on time for today's session.  Assessed progress since last visit.  Patient stated he has remained sober for the past several weeks.  He stated he has discontinued any illicit drug use, alcohol use.  Time spent on outpatient to share details, challenges and how he will comply person.  To make this progress and subsequent positive changes as a result.  He stated that he has secured a job, however, lost this job about 4 weeks ago and due to [employer].  Patient stated this was upsetting due to his working hard, showing up on time, only to lose the opportunity.  At this point he continues to look for other jobs.  We provide support and encouragement, facilitating patient further identifying positives about his making the changes discussed as well as his efforts at work; he shared the notable effort he put him at the job and how this was different next week this past.  He plans to continue his sobriety.  Ways to cope and care for himself, continuing to  work out, exercise and be mindful self-talk as discussed in session.    Interventions: Cognitive Behavioral Therapy, Ego-Supportive, and Insight-Oriented  Diagnosis:   ICD-10-CM   1. Generalized anxiety disorder  F41.1         Plan: Patient to utilize coping skills as discussed, utilize his support system, continue to take steps toward health and wellness by continuing his exercise regimen.   Long-term goal:   Reduce overall level, frequency, and intensity anxiety and panic per client report for at least 3 consecutive months.  Short-term goal:  Identifying specific situations/setting that leads to increased feelings of anxiety and panic Verbally express understanding of the relationship between feelings of depression, anxiety and their impact on thinking patterns and behaviors. Verbalize an understanding of the role that distorted thinking plays in creating fears, excessive worry, and ruminations.  Increase confidence, feelings of higher self-worth   [name] Session, [facility]

## 2023-07-27 ENCOUNTER — Encounter: Payer: Self-pay | Admitting: Psychiatry

## 2023-08-19 ENCOUNTER — Encounter: Payer: Self-pay | Admitting: Psychiatry

## 2023-08-19 ENCOUNTER — Ambulatory Visit (INDEPENDENT_AMBULATORY_CARE_PROVIDER_SITE_OTHER): Payer: Medicaid Other | Admitting: Psychiatry

## 2023-08-19 DIAGNOSIS — F88 Other disorders of psychological development: Secondary | ICD-10-CM | POA: Diagnosis not present

## 2023-08-19 DIAGNOSIS — F411 Generalized anxiety disorder: Secondary | ICD-10-CM | POA: Diagnosis not present

## 2023-08-19 DIAGNOSIS — F410 Panic disorder [episodic paroxysmal anxiety] without agoraphobia: Secondary | ICD-10-CM | POA: Diagnosis not present

## 2023-08-19 DIAGNOSIS — F340 Cyclothymic disorder: Secondary | ICD-10-CM

## 2023-08-19 MED ORDER — PAROXETINE HCL 30 MG PO TABS
30.0000 mg | ORAL_TABLET | Freq: Every day | ORAL | 2 refills | Status: DC
Start: 2023-08-19 — End: 2024-02-17

## 2023-08-19 MED ORDER — LAMOTRIGINE 150 MG PO TABS
300.0000 mg | ORAL_TABLET | Freq: Every day | ORAL | 3 refills | Status: DC
Start: 2023-08-19 — End: 2024-02-17

## 2023-08-19 NOTE — Progress Notes (Signed)
Tony Hayes [id number] 1990/10/27 32 y.o.  Subjective:   Patient ID:  Tony Hayes is a 32 y.o. (DOB 03/05/1991) male.  Chief Complaint:  Chief Complaint  Patient presents with   Follow-up    Anxiety and depression    HPI Tony Hayes presents to the office today for follow-up of anxiety and depression. 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 "My life is looking so much better. I have hope now." He reports that he "cut off people from my past" since some people were triggers to using. He reports that he "re-dedicated my life to God." He reports, "I don't even feel like I am even the same person." He reports that his mood has been "good... for once I can say I am happy with myself." He describes experiencing a wider range of emotions. He reports, "I think right now my medication is perfect the way it is." He reports that he has removed things from his life that have caused him significant anxiety in the past. He reports concentration is "pretty good." He reports that his appetite varies. He reports sleeping ok with shift in sleep cycle since out of work. Energy improved since stopping nicotine. Denies SI- "I'm excited about the future."   He reports that he had a job and was laid off. He reports that he coped with this better than he expected.   He is close with his grandmother and she is having some health issues.   He reports improved relationship with his parents.   Seeing Tony Hayes, [facility] for therapy.   Past Psychiatric Medication Trials: Seroquel Risperdal-Increased prolactin Abilify Paxil Effexor Citalopram Remeron- slurred speech, blurred vision, staggering Wellbutrin Lamictal Valium Intuniv Vyvanse Trazodone Propranolol  AIMS    Flowsheet Row Office Visit from 03/07/2023 in [facility] Office Visit from 01/03/2023 in [facility] Visit from 07/05/2022 in [facility]  AIMS Total Score 0 0 1      PHQ2-9    Flowsheet Row Office Visit from 05/10/2023 in [facility] Total Score 2  PHQ-9 Total Score 17        Review of Systems:  Review of Systems  Musculoskeletal:  Negative for gait problem.  Skin:  Negative for rash.  Psychiatric/Behavioral:         Please refer to HPI    Medications: I have reviewed the patient's current medications.  Current Outpatient Medications  Medication Sig Dispense Refill   Cholecalciferol (VITAMIN D3) 50 MCG (2000 UT) CAPS Take by mouth.     MAGNESIUM PO Take by mouth at bedtime as needed.     Multiple Vitamin (MULTIVITAMIN) tablet Take 1 tablet by mouth daily.     Omega-3 Fatty Acids (FISH OIL PO) Take by mouth.     lamoTRIgine (LAMICTAL) 150 MG tablet Take 2 tablets (300 mg total) by mouth at bedtime. 180 tablet 3   PARoxetine (PAXIL) 30 MG tablet Take 1 tablet (30 mg total) by mouth at bedtime. 90 tablet 2   No current facility-administered medications for this visit.    Medication Side Effects: None  Allergies: No Known Allergies  Past Medical History:  Diagnosis Date   Anxiety    Bipolar disorder (HCC)    Chronic kidney disease    kidney stone   Depression    Headache    Heart  murmur 5 years ago   mother states she doesn't know name for heart condition, possible murmer    Past Medical History, Surgical history, Social history, and Family history were reviewed and updated as appropriate.   Please see review of systems for further details on the patient's review from today.   Objective:   Physical Exam:  There were no vitals taken for this visit.  Physical Exam Constitutional:      General: He is not in acute distress. Musculoskeletal:        General: No deformity.  Neurological:     Mental Status: He is alert and oriented to person, place, and time.      Coordination: Coordination normal.  Psychiatric:        Attention and Perception: Attention and perception normal. He does not perceive auditory or visual hallucinations.        Mood and Affect: Mood normal. Mood is not anxious or depressed. Affect is not labile, blunt, angry or inappropriate.        Speech: Speech normal.        Behavior: Behavior normal.        Thought Content: Thought content normal. Thought content is not paranoid or delusional. Thought content does not include homicidal or suicidal ideation. Thought content does not include homicidal or suicidal plan.        Cognition and Memory: Cognition and memory normal.        Judgment: Judgment normal.     Comments: Insight intact     Lab Review:     Component Value Date/Time   NA 137 05/10/2023 1059   K 4.0 05/10/2023 1059   CL 100 05/10/2023 1059   CO2 27 05/10/2023 1059   GLUCOSE 90 05/10/2023 1059   BUN 13 05/10/2023 1059   CREATININE 1.30 05/10/2023 1059   CALCIUM 10.1 05/10/2023 1059   PROT 7.1 05/10/2023 1059   ALBUMIN 4.6 05/10/2023 1059   AST 24 05/10/2023 1059   ALT 40 05/10/2023 1059   ALKPHOS 55 05/10/2023 1059   BILITOT 0.5 05/10/2023 1059   GFRNONAA >60 05/02/2019 1341   GFRAA >60 05/02/2019 1341       Component Value Date/Time   WBC 8.4 05/02/2019 1341   RBC 5.22 05/02/2019 1341   HGB 15.9 05/02/2019 1341   HCT 47.5 05/02/2019 1341   PLT 291 05/02/2019 1341   MCV 91.0 05/02/2019 1341   MCH 30.5 05/02/2019 1341   MCHC 33.5 05/02/2019 1341   RDW 11.9 05/02/2019 1341   LYMPHSABS 2.0 09/05/2014 1255   MONOABS 1.3 (H) 09/05/2014 1255   EOSABS 0.1 09/05/2014 1255   BASOSABS 0.0 09/05/2014 1255    No results found for: "POCLITH", "LITHIUM"   No results found for: "PHENYTOIN", "PHENOBARB", "VALPROATE", "CBMZ"   .res Assessment: Plan:   31 minutes spent dedicated to the care of this patient on the date of this encounter to include pre-visit review of records, ordering of medication, post  visit documentation, and face-to-face time with the patient discussing changes to psychosocial history and recent sobriety. Pt reports a significant improvement in his mood and and anxiety since the start of his sobriety. Continue Paxil 30 mg daily for anxiety and depression.  Continue Lamictal 300 mg daily for mood.  Recommend continuing psychotherapy with Tony Hayes, [facility].  Pt to follow-up in 6 months or sooner if clinically indicated. Discussed transitioning care to Melony Overly, PA since provider is leaving the practice.  Patient advised to contact office with any  questions, adverse effects, or acute worsening in signs and symptoms.   Tony "Ronaldo Miyamoto" was seen today for follow-up.  Diagnoses and all orders for this visit:  Generalized anxiety disorder -     PARoxetine (PAXIL) 30 MG tablet; Take 1 tablet (30 mg total) by mouth at bedtime.  Panic disorder -     PARoxetine (PAXIL) 30 MG tablet; Take 1 tablet (30 mg total) by mouth at bedtime.  Cyclothymic disorder -     lamoTRIgine (LAMICTAL) 150 MG tablet; Take 2 tablets (300 mg total) by mouth at bedtime.  Secondary neurodevelopmental disorder -     lamoTRIgine (LAMICTAL) 150 MG tablet; Take 2 tablets (300 mg total) by mouth at bedtime.     Please see After Visit Summary for patient specific instructions.  Future Appointments  Date Time Provider Department Center  09/16/2023 11:00 AM [name] Session, [facility] None  02/17/2024  1:00 PM Melony Overly T, PA-C CP-CP None    No orders of the defined types were placed in this encounter.   -------------------------------

## 2023-08-29 ENCOUNTER — Ambulatory Visit: Payer: Self-pay | Admitting: Psychiatry

## 2023-09-16 ENCOUNTER — Ambulatory Visit (INDEPENDENT_AMBULATORY_CARE_PROVIDER_SITE_OTHER): Payer: Medicaid Other | Admitting: Mental Health

## 2023-09-16 DIAGNOSIS — F411 Generalized anxiety disorder: Secondary | ICD-10-CM

## 2023-09-16 NOTE — Progress Notes (Signed)
      [facility] Counselor/Therapist Progress Note  Patient ID: Tony Hayes, MRN: [id number],    Date: 09/16/23  Time Spent: 51 minutes   Treatment Type: Individual Therapy  Reported Symptoms: Depressed mood, anhedonia, anxiety    Mental Status Exam:    Appearance:    Casual     Behavior:   Appropriate  Motor:   WNL  Speech/Language:    Clear and Coherent  Affect:   Full range   Mood:   Euthymic  Thought process:   normal  Thought content:     WNL  Sensory/Perceptual disturbances:     none  Orientation:   x4  Attention:   Good  Concentration:   Good  Memory:   Intact  Fund of knowledge:    Consistent with age and development  Insight:     Good  Judgment:    Good  Impulse Control:   Good     Risk Assessment: Danger to Self:  No Self-injurious Behavior: No Danger to Others: No Duty to Warn:no Physical Aggression / Violence:No  Access to Firearms a concern: No  Gang Involvement:No   Subjective: Patient presents on time for today's session.  Patient shared recent events, how he has been more anxious due to recent [place] events.  He stated that he struggles at times with consuming media online, some applications specifically.  We explored collaboratively ways to set a boundary for himself as this was identified as a need.  He plans to avoid 1 application specifically as he spends too much time on it per his report.  Provided support and time for patient to identify recent events and feelings associated.  He stated he is trying to be positive although there have been many negative events occurring.  We discussed the concept of radical acceptance, gave examples and facilitated his identifying how he could utilize for himself.  He also acknowledged that he needs to continue to stay busy with interests such as hiking which he has enjoyed with his father recently.  He stated he also has applied for a job at a [employer] and is hopeful that he will be given an  opportunity for employment and feels this also will help with his mood.    Interventions: Cognitive Behavioral Therapy, Ego-Supportive, and Insight-Oriented  Diagnosis:   ICD-10-CM   1. Generalized anxiety disorder  F41.1          Plan: Patient to utilize coping skills as discussed, utilize his support system, continue to take steps toward health and wellness by continuing his exercise regimen.   Long-term goal:   Reduce overall level, frequency, and intensity anxiety and panic per client report for at least 3 consecutive months.  Short-term goal:  Identifying specific situations/setting that leads to increased feelings of anxiety and panic Verbally express understanding of the relationship between feelings of depression, anxiety and their impact on thinking patterns and behaviors. Verbalize an understanding of the role that distorted thinking plays in creating fears, excessive worry, and ruminations.  Increase confidence, feelings of higher self-worth   Lonni Fischer, [facility]

## 2023-10-21 ENCOUNTER — Ambulatory Visit: Payer: Medicaid Other | Admitting: Mental Health

## 2023-10-21 DIAGNOSIS — F411 Generalized anxiety disorder: Secondary | ICD-10-CM

## 2023-10-21 NOTE — Progress Notes (Signed)
      [facility] Counselor/Therapist Progress Note  Patient ID: BENZION MESTA, MRN: [id number],    Date: 10/21/23  Time Spent: 50 minutes   Treatment Type: Individual Therapy  Reported Symptoms: Depressed mood, anhedonia, anxiety    Mental Status Exam:    Appearance:    Casual     Behavior:   Appropriate  Motor:   WNL  Speech/Language:    Clear and Coherent  Affect:   Full range   Mood:   Euthymic  Thought process:   normal  Thought content:     WNL  Sensory/Perceptual disturbances:     none  Orientation:   x4  Attention:   Good  Concentration:   Good  Memory:   Intact  Fund of knowledge:    Consistent with age and development  Insight:     Good  Judgment:    Good  Impulse Control:   Good     Risk Assessment: Danger to Self:  No Self-injurious Behavior: No Danger to Others: No Duty to Warn:no Physical Aggression / Violence:No  Access to Firearms a concern: No  Gang Involvement:No   Subjective: Patient presents on time for today's session.  Assessed progress.  Patient stated he continues his sobriety, abstinence from any alcohol or drug use.  He went on to share positive benefits of making these changes.  He stated that he has moments where he recalls instances where he was using, how he is reminded if he sees someone he might have known, landmarks around town, [place].  He was able to identify reasons to continue his sobriety, where he is highly motivated and sharing positive changes.  Facilitated his sharing further reasons he made the change where he stated that he had a couple of instances that were high risk using as well as his promising his grandmother prior to her passing that he would change. He stated that he is trying to make friends and is open to dating.  He stated that he started attending church and has met someone whom he has some interest.  He stated that he had some anxiety as she had to cancel their date.  Worked with patient from a cognitive  behavioral framework reviewing some concepts related to cognitive distortions and some psychoeducation further related to CBT model.  He plans to utilize between visits.     Interventions: Cognitive Behavioral Therapy, Ego-Supportive, and Insight-Oriented  Diagnosis:   ICD-10-CM   1. Generalized anxiety disorder  F41.1           Plan: Patient to utilize coping skills as discussed, utilize his support system, continue to take steps toward health and wellness by continuing his exercise regimen.   Long-term goal:   Reduce overall level, frequency, and intensity anxiety and panic per client report for at least 3 consecutive months.  Short-term goal:  Identifying specific situations/setting that leads to increased feelings of anxiety and panic Verbally express understanding of the relationship between feelings of depression, anxiety and their impact on thinking patterns and behaviors. Verbalize an understanding of the role that distorted thinking plays in creating fears, excessive worry, and ruminations.  Increase confidence, feelings of higher self-worth   Lonni Fischer, [facility]

## 2023-11-18 ENCOUNTER — Ambulatory Visit: Payer: Medicaid Other | Admitting: Mental Health

## 2023-11-18 DIAGNOSIS — F411 Generalized anxiety disorder: Secondary | ICD-10-CM

## 2023-11-18 NOTE — Progress Notes (Signed)
      [facility] Counselor/Therapist Progress Note  Patient ID: Tony Hayes, MRN: [id number],    Date:  11/18/23  Time Spent: 52 minutes   Treatment Type: Individual Therapy  Reported Symptoms: Depressed mood, anhedonia, anxiety    Mental Status Exam:    Appearance:    Casual     Behavior:   Appropriate  Motor:   WNL  Speech/Language:    Clear and Coherent  Affect:   Full range   Mood:   Euthymic  Thought process:   normal  Thought content:     WNL  Sensory/Perceptual disturbances:     none  Orientation:   x4  Attention:   Good  Concentration:   Good  Memory:   Intact  Fund of knowledge:    Consistent with age and development  Insight:     Good  Judgment:    Good  Impulse Control:   Good     Risk Assessment: Danger to Self:  No Self-injurious Behavior: No Danger to Others: No Duty to Warn:no Physical Aggression / Violence:No  Access to Firearms a concern: No  Gang Involvement:No   Subjective: Patient presents on time for today's session.  Patient shared recent stressors, identifying the need to set a boundary interpersonally with himself in terms of how much media he consumes.  He stated that he often finds himself looking at news articles and other forms of media to stay aware and updated about current events however, he stated that this can be excessive going on to share details.  Ways to set a boundary was explored collaboratively.  He feels that if he follows through this will help manage some of his anxiety associated.  He shared how he can ruminate at times at night based on what he is obtained earlier in the day.  Ways to cope and care for himself, other outlets of interest were explored where he stated that since his sobriety, he values his time in the outdoors.  Per the weather, he plans to start camping and continue to hike.    Interventions: Cognitive Behavioral Therapy, Ego-Supportive, and Insight-Oriented  Diagnosis:   ICD-10-CM   1. Generalized  anxiety disorder  F41.1            Plan: Patient to utilize coping skills as discussed, utilize his support system, continue to take steps toward health and wellness by continuing his exercise regimen.   Long-term goal:   Reduce overall level, frequency, and intensity anxiety and panic per client report for at least 3 consecutive months.  Short-term goal:  Identifying specific situations/setting that leads to increased feelings of anxiety and panic Verbally express understanding of the relationship between feelings of depression, anxiety and their impact on thinking patterns and behaviors. Verbalize an understanding of the role that distorted thinking plays in creating fears, excessive worry, and ruminations.  Increase confidence, feelings of higher self-worth   [name] Session, [facility]

## 2023-12-16 ENCOUNTER — Ambulatory Visit (INDEPENDENT_AMBULATORY_CARE_PROVIDER_SITE_OTHER): Admitting: Mental Health

## 2023-12-16 DIAGNOSIS — F411 Generalized anxiety disorder: Secondary | ICD-10-CM

## 2023-12-16 NOTE — Progress Notes (Signed)
      [facility] Counselor/Therapist Progress Note  Patient ID: Tony Hayes, MRN: [id number],    Date:  12/16/23  Time Spent: 51 minutes   Treatment Type: Individual Therapy  Reported Symptoms: Depressed mood, anhedonia, anxiety    Mental Status Exam:    Appearance:    Casual     Behavior:   Appropriate  Motor:   WNL  Speech/Language:    Clear and Coherent  Affect:   Full range   Mood:   Euthymic  Thought process:   normal  Thought content:     WNL  Sensory/Perceptual disturbances:     none  Orientation:   x4  Attention:   Good  Concentration:   Good  Memory:   Intact  Fund of knowledge:    Consistent with age and development  Insight:     Good  Judgment:    Good  Impulse Control:   Good     Risk Assessment: Danger to Self:  No Self-injurious Behavior: No Danger to Others: No Duty to Warn:no Physical Aggression / Violence:No  Access to Firearms a concern: No  Gang Involvement:No   Subjective: Patient presents on time for today's session.  Assessed progress. He shared how he is continuing to try and prioritize self-care, getting exercise, adhering to a consistent sleep schedule. He continues to maintain his sobriety going on to share positive outcomes as a result. Identify some sources of distress, difficulty finding a job that would meet his needs and also making friendships as he has had to discontinue many friendships of the past as a component of relapse prevention. He's been successful in maintaining these boundaries. Facilitated his identifying needs socially where he wants to potentially meet romantic partner share the challenges of his efforts of trying to date online.  He identified also how this connects to his feeling more self-confident about his life and future.  Facilitated his further identifying potential ways to meet others, meet up groups, through his church group. Encouraged him to recognize his progress, allowing for time in the possibility of  meeting others and forming meaningful relationships.     Interventions: Cognitive Behavioral Therapy, Ego-Supportive, and Insight-Oriented  Diagnosis:   ICD-10-CM   1. Generalized anxiety disorder  F41.1             Plan: Patient to utilize coping skills as discussed, utilize his support system, continue to take steps toward health and wellness by continuing his exercise regimen.   Long-term goal:   Reduce overall level, frequency, and intensity anxiety and panic per client report for at least 3 consecutive months.  Short-term goal:  Identifying specific situations/setting that leads to increased feelings of anxiety and panic Verbally express understanding of the relationship between feelings of depression, anxiety and their impact on thinking patterns and behaviors. Verbalize an understanding of the role that distorted thinking plays in creating fears, excessive worry, and ruminations.  Increase confidence, feelings of higher self-worth   [name] Session, [facility]

## 2024-01-12 ENCOUNTER — Ambulatory Visit (INDEPENDENT_AMBULATORY_CARE_PROVIDER_SITE_OTHER): Admitting: Mental Health

## 2024-01-12 DIAGNOSIS — F411 Generalized anxiety disorder: Secondary | ICD-10-CM

## 2024-01-12 NOTE — Progress Notes (Signed)
 [facility] Counselor/Therapist Progress Note  Patient ID: Tony Hayes, MRN: [id number],    Date: 01/12/2024  Time Spent: 51 minutes  Time in: 11: 00 a.m. time out 11: 50 1 AM  Treatment Type: Individual Therapy  Reported Symptoms: Depressed mood, anhedonia, anxiety    Mental Status Exam:    Appearance:    Casual     Behavior:   Appropriate  Motor:   WNL  Speech/Language:    Clear and Coherent  Affect:   Full range   Mood:   Euthymic  Thought process:   normal  Thought content:     WNL  Sensory/Perceptual disturbances:     none  Orientation:   x4  Attention:   Good  Concentration:   Good  Memory:   Intact  Fund of knowledge:    Consistent with age and development  Insight:     Good  Judgment:    Good  Impulse Control:   Good     Risk Assessment: Danger to Self:  No Self-injurious Behavior: No Danger to Others: No Duty to Warn:no Physical Aggression / Violence:No  Access to Firearms a concern: No  Gang Involvement:No   Subjective: Patient presents on time for today's session.  He shared recent events, how he went on a date with a girl recently which ultimately did not work out.  He went on to share some frustrations, challenges with dating.  He wants to avoid anyone who wants to go to bars as he wants to continue to maintain his sobriety.  He identified how he continues to need to work on maintaining boundaries with online content as this can typically increase his anxiety levels.  Ways to maintain this boundary were explored collaboratively.  He plans to check new sources for world and national events weekly as opposed to daily.  He shared positive news, he is obtaining a job at a [employer].  He realized after meeting his supervisor that they both had dated the same woman in the past, this leaving and feeling somewhat anxious and distressed as he does not want to reflect on this past for himself due to events shared that ultimately led to their  break-up.  He stated they were engaged which made it even more emotionally challenging at that time.  Assisted him in framing the situation, how he acknowledged to his supervisor did not react in a way that he thought it might jeopardize his employment or even their working relationship.  Patient was encouraged to recognize progress he has made in maintaining his abstinence and other ways he is improving his life-trying to build healthy relationships, focusing on self-care getting enough rest and exercising, securing employment.   Interventions: Cognitive Behavioral Therapy, Ego-Supportive, and Insight-Oriented  Diagnosis:   ICD-10-CM   1. Generalized anxiety disorder  F41.1              Plan: Patient to utilize coping skills as discussed, utilize his support system, continue to take steps toward health and wellness by continuing his exercise regimen.   Long-term goal:   Reduce overall level, frequency, and intensity anxiety and panic per client report for at least 3 consecutive months.  Short-term goal:  Identifying specific situations/setting that leads to increased feelings of anxiety and panic Verbally express understanding of the relationship between feelings of depression, anxiety and their impact on thinking patterns and behaviors. Verbalize an understanding of the role that distorted thinking plays in creating fears, excessive worry,  and ruminations.  Increase confidence, feelings of higher self-worth   Avram Lenis, [facility]

## 2024-02-15 ENCOUNTER — Ambulatory Visit (INDEPENDENT_AMBULATORY_CARE_PROVIDER_SITE_OTHER): Admitting: Family Medicine

## 2024-02-15 ENCOUNTER — Encounter: Payer: Self-pay | Admitting: Family Medicine

## 2024-02-15 ENCOUNTER — Ambulatory Visit: Payer: Self-pay | Admitting: Family Medicine

## 2024-02-15 VITALS — BP 118/84 | HR 68 | Temp 97.0°F | Ht 70.0 in | Wt 252.4 lb

## 2024-02-15 DIAGNOSIS — F411 Generalized anxiety disorder: Secondary | ICD-10-CM | POA: Diagnosis not present

## 2024-02-15 DIAGNOSIS — F3177 Bipolar disorder, in partial remission, most recent episode mixed: Secondary | ICD-10-CM | POA: Insufficient documentation

## 2024-02-15 DIAGNOSIS — E66812 Obesity, class 2: Secondary | ICD-10-CM | POA: Diagnosis not present

## 2024-02-15 DIAGNOSIS — Z6836 Body mass index (BMI) 36.0-36.9, adult: Secondary | ICD-10-CM

## 2024-02-15 DIAGNOSIS — E6609 Other obesity due to excess calories: Secondary | ICD-10-CM | POA: Diagnosis not present

## 2024-02-15 DIAGNOSIS — R350 Frequency of micturition: Secondary | ICD-10-CM

## 2024-02-15 DIAGNOSIS — R7303 Prediabetes: Secondary | ICD-10-CM | POA: Insufficient documentation

## 2024-02-15 DIAGNOSIS — Z0000 Encounter for general adult medical examination without abnormal findings: Secondary | ICD-10-CM | POA: Insufficient documentation

## 2024-02-15 LAB — URINALYSIS, ROUTINE W REFLEX MICROSCOPIC
Bilirubin Urine: NEGATIVE
Hgb urine dipstick: NEGATIVE
Ketones, ur: NEGATIVE
Leukocytes,Ua: NEGATIVE
Nitrite: NEGATIVE
RBC / HPF: NONE SEEN (ref 0–?)
Specific Gravity, Urine: 1.02 (ref 1.000–1.030)
Total Protein, Urine: NEGATIVE
Urine Glucose: NEGATIVE
Urobilinogen, UA: 0.2 (ref 0.0–1.0)
pH: 6 (ref 5.0–8.0)

## 2024-02-15 LAB — GLUCOSE, RANDOM: Glucose, Bld: 98 mg/dL (ref 70–99)

## 2024-02-15 LAB — HEMOGLOBIN A1C: Hgb A1c MFr Bld: 5.8 % (ref 4.6–6.5)

## 2024-02-15 NOTE — Assessment & Plan Note (Signed)
 Weight has been stable over the past year. I recommend he increase the intensity of his work-outs, with a target heart rate of 130-150 bpm. Also discussed reducing carbs in diet to create a calorie deficit.

## 2024-02-15 NOTE — Assessment & Plan Note (Signed)
 Stable. Continue lamotrigine  150 mg daily and paroxetine  30 mg daily and working with behavioral health.

## 2024-02-15 NOTE — Assessment & Plan Note (Signed)
 Overall health is excellent. Recommend ongoing regular exercise, but increasing intensity to help with CV health and weight loss. Discussed recommended screenings and immunizations.

## 2024-02-15 NOTE — Progress Notes (Signed)
 [facility] CARE [facility] [address] [place] [address] Dept: [phone] Dept Fax: [phone]  Annual Physical Visit  Subjective:    Patient ID: Tony Hayes, male    DOB: August 16, 1991, 33 y.o..   MRN: [id number]  Chief Complaint  Patient presents with   Annual Exam    CPE/labs.  Fasting today.  C/o having urine frequency and weight issues.     History of Present Illness:  Patient is in today for an annual physical/preventative visit.  Tony Hayes has a history of multiple behavioral health issues, including cyclothymia, generalized anxiety, and panic disorder. He is currently managed on lamotrigine  150 mg daily and paroxetine  30 mg daily. He seems Roselyn Connor PMHNP for medication management and [facility] for counseling.   Review of Systems  Constitutional:  Negative for chills, diaphoresis, fever, malaise/fatigue and weight loss.       Notes difficulty with weight loss. He is walking 2 miles in 1 hour 3-4 times a week. He is concerned that he is not losing weight with this.  HENT:  Negative for congestion, ear pain, hearing loss, sinus pain, sore throat and tinnitus.   Eyes:  Negative for blurred vision, pain, discharge and redness.  Respiratory:  Negative for cough, shortness of breath and wheezing.   Cardiovascular:  Negative for chest pain and palpitations.  Gastrointestinal:  Positive for constipation. Negative for abdominal pain, diarrhea, heartburn, nausea and vomiting.       Has bowel movements about every other day. These do often involve straining.  Genitourinary:  Positive for frequency.       Notes he can often need to urinate 3-4 times when he tries to sit through a movie. He drinks about 1 gallon of water  a day.  Musculoskeletal:  Negative for back pain, joint pain and myalgias.       Frequent popping of joints, esp. when stretching or first thing in the morning. No pain associated with this.   Skin:  Negative for itching and rash.  Psychiatric/Behavioral:  Negative for depression. The patient is nervous/anxious.        See above.   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   Past Surgical History:  Procedure Laterality Date   CYSTOSCOPY WITH RETROGRADE PYELOGRAM, URETEROSCOPY AND STENT PLACEMENT Right 09/05/2014   Procedure: CYSTOSCOPY WITH RIGHT RETROGRADE PYELOGRAM AND STENT PLACEMENT;  Surgeon: Edmund Gouge, MD;  Location: [facility];  Service: Urology;  Laterality: Right;   CYSTOSCOPY WITH RETROGRADE PYELOGRAM, URETEROSCOPY AND STENT PLACEMENT Right 10/21/2014   Procedure: CYSTOSCOPY WITH RIGHT RETROGRADE PYELOGRAM, RIGHT URETEROSCOPY AND  STONE EXTRACTION ,STENT PLACEMENT;  Surgeon: Edmund Gouge, MD;  Location: [facility];  Service: Urology;  Laterality: Right;   HOLMIUM LASER APPLICATION Right 10/21/2014   Procedure: HOLMIUM LASER ;  Surgeon: Edmund Gouge, MD;  Location: [facility];  Service: Urology;  Laterality: Right;   ORCHIOPEXY     33 year of age   WISDOM TOOTH  EXTRACTION     all removed at age 27   Family History  Adopted: Yes  Problem Relation Age of Onset   Drug abuse Mother    Outpatient Medications Prior to Visit  Medication Sig Dispense Refill   Cholecalciferol (VITAMIN D3) 50 MCG (2000 UT) CAPS Take by mouth.     lamoTRIgine  (LAMICTAL ) 150 MG tablet Take 2 tablets (300 mg total) by  mouth at bedtime. 180 tablet 3   MAGNESIUM PO Take by mouth at bedtime as needed.     Multiple Vitamin (MULTIVITAMIN) tablet Take 1 tablet by mouth daily.     Omega-3 Fatty Acids (FISH OIL PO) Take by mouth.     PARoxetine  (PAXIL ) 30 MG tablet Take 1 tablet (30 mg total) by mouth at bedtime. 90 tablet 2   No facility-administered medications prior to visit.   No Known Allergies Objective:   Today's Vitals   02/15/24 0818  BP: 118/84  Pulse: 68  Temp: (!) 97 F (36.1 C)  TempSrc: Temporal  SpO2: 97%  Weight: 252 lb 6.4 oz (114.5 kg)  Height: 5\' 10"  (1.778 m)   Body mass index is 36.22 kg/m.   General: Well developed, well nourished. No acute distress. HEENT: Normocephalic, non-traumatic. PERRL, EOMI. Conjunctiva clear. External ears normal. EAC and TMs normal   bilaterally. Nose clear without congestion or rhinorrhea. Mucous membranes moist. Oropharynx clear. Good dentition. Neck: Supple. No lymphadenopathy. No thyromegaly. Lungs: Clear to auscultation bilaterally. No wheezing, rales or rhonchi. CV: RRR without murmurs or rubs. Pulses 2+ bilaterally. Abdomen: Soft, non-tender. Bowel sounds positive, normal pitch and frequency. No hepatosplenomegaly. No rebound   or guarding. Extremities: Full ROM. No joint swelling or tenderness. No edema noted. Skin: Warm and dry. No rashes. Psych: Alert and oriented. Normal mood and affect.  Health Maintenance Due  Topic Date Due   HIV Screening  Never done   Hepatitis C Screening  Never done   HPV VACCINES (2 - Male 3-dose series) 02/23/2012     Assessment & Plan:   Problem List Items Addressed This Visit       Other   Annual physical exam - Primary   Overall health is excellent. Recommend ongoing regular exercise, but increasing intensity to help with CV health and weight loss. Discussed recommended screenings and immunizations.       Class 2 obesity due to excess calories with body mass index (BMI) of 36.0 to 36.9 in adult    Weight has been stable over the past year. I recommend he increase the intensity of his work-outs, with a target heart rate of 130-150 bpm. Also discussed reducing carbs in diet to create a calorie deficit.      Generalized anxiety disorder   Stable. Continue lamotrigine  150 mg daily and paroxetine  30 mg daily and working with behavioral health.      Other Visit Diagnoses       Urinary frequency       I will assess a UA, glucose, and A1c. Thsi may be due to the amount of fluid he takes in each day.   Relevant Orders   Glucose, random   Hemoglobin A1c   Urinalysis, Routine w reflex microscopic       Return in about 1 year (around 02/14/2025) for Annual preventative care.   Graig Lawyer, MD

## 2024-02-17 ENCOUNTER — Ambulatory Visit: Payer: Medicaid Other | Admitting: Physician Assistant

## 2024-02-17 ENCOUNTER — Encounter: Payer: Self-pay | Admitting: Physician Assistant

## 2024-02-17 DIAGNOSIS — F902 Attention-deficit hyperactivity disorder, combined type: Secondary | ICD-10-CM | POA: Diagnosis not present

## 2024-02-17 DIAGNOSIS — F410 Panic disorder [episodic paroxysmal anxiety] without agoraphobia: Secondary | ICD-10-CM

## 2024-02-17 DIAGNOSIS — F88 Other disorders of psychological development: Secondary | ICD-10-CM | POA: Diagnosis not present

## 2024-02-17 DIAGNOSIS — F340 Cyclothymic disorder: Secondary | ICD-10-CM

## 2024-02-17 DIAGNOSIS — F4321 Adjustment disorder with depressed mood: Secondary | ICD-10-CM

## 2024-02-17 DIAGNOSIS — F411 Generalized anxiety disorder: Secondary | ICD-10-CM | POA: Diagnosis not present

## 2024-02-17 MED ORDER — LAMOTRIGINE 150 MG PO TABS: 300.0000 mg | ORAL_TABLET | Freq: Every day | ORAL | 1 refills | Status: DC

## 2024-02-17 MED ORDER — PAROXETINE HCL 30 MG PO TABS: 30.0000 mg | ORAL_TABLET | Freq: Every day | ORAL | 1 refills | Status: DC

## 2024-02-17 NOTE — Progress Notes (Signed)
 [facility] Med [facility]  Patient ID: Tony Hayes,  MRN: [id number]  PCP: Tony Lawyer, MD  Date of Evaluation: 02/17/2024 Time spent:20 minutes  Chief Complaint:  Chief Complaint   Anxiety; Follow-up     HISTORY/CURRENT STATUS: HPI transferring to my care care from Tony Connor, NP who is no longer with the practice.  Tony Hayes states he is doing really well.  He lost his grandmother earlier this year.  They were close and he is grieving but knows she is in a better place which helps.  In his free time he enjoys hiking.  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  He stays really busy which he likes.  Energy and motivation are good.   No extreme sadness, tearfulness, or feelings of hopelessness.  Sleeps well. ADLs and personal hygiene are normal.   Denies any changes in concentration, making decisions, or remembering things.  Appetite has not changed.  Weight is stable.  Complaints of anxiety or panic attacks.  Denies suicidal or homicidal thoughts.  Patient denies increased energy with decreased need for sleep, increased talkativeness, racing thoughts, impulsivity or risky behaviors, increased spending, increased libido, grandiosity, increased irritability or anger, paranoia, or hallucinations.  Denies dizziness, syncope, seizures, numbness, tingling, tremor, tics, unsteady gait, slurred speech, confusion. Denies muscle or joint pain, stiffness, or dystonia.  Individual Medical History/ Review of Systems: Changes? :No   Past Psychiatric Medication Trials: Seroquel  Risperdal -Increased prolactin Abilify  Paxil  Effexor Citalopram Remeron- slurred speech, blurred vision, staggering Wellbutrin Lamictal  Valium  Intuniv Vyvanse Trazodone Propranolol   Allergies: Patient has no known allergies.  Current Medications:  Current Outpatient Medications:    Cholecalciferol (VITAMIN D3) 50 MCG (2000 UT) CAPS, Take by mouth., Disp: , Rfl:     MAGNESIUM PO, Take by mouth at bedtime as needed., Disp: , Rfl:    Multiple Vitamin (MULTIVITAMIN) tablet, Take 1 tablet by mouth daily., Disp: , Rfl:    Omega-3 Fatty Acids (FISH OIL PO), Take by mouth., Disp: , Rfl:    lamoTRIgine  (LAMICTAL ) 150 MG tablet, Take 2 tablets (300 mg total) by mouth at bedtime., Disp: 180 tablet, Rfl: 1   PARoxetine  (PAXIL ) 30 MG tablet, Take 1 tablet (30 mg total) by mouth at bedtime., Disp: 90 tablet, Rfl: 1 Medication Side Effects: none  Family Medical/ Social History: Changes? Yes  new job at [employer], His grandmother died earlier this year.  MENTAL HEALTH EXAM:  There were no vitals taken for this visit.There is no height or weight on file to calculate BMI.  General Appearance: Casual and Well Groomed  Eye Contact:  Good  Speech:  Clear and Coherent and Normal Rate  Volume:  Normal  Mood:  Euthymic  Affect:  Congruent  Thought Process:  Goal Directed and Descriptions of Associations: Circumstantial  Orientation:  Full (Time, Place, and Person)  Thought Content: Logical   Suicidal Thoughts:  No  Homicidal Thoughts:  No  Memory:  WNL  Judgement:  Good  Insight:  Good  Psychomotor Activity:  Normal  Concentration:  Concentration: Good and Attention Span: Good  Recall:  Good  Fund of Knowledge: Good  Language: Good  Assets:  Desire for Improvement Financial Resources/Insurance Housing Transportation Vocational/Educational  ADL's:  Intact  Cognition: WNL  Prognosis:  Good   DIAGNOSES:    ICD-10-CM   1. Generalized anxiety disorder  F41.1 PARoxetine  (PAXIL ) 30 MG tablet    2. Cyclothymic disorder  F34.0 lamoTRIgine  (LAMICTAL ) 150 MG  tablet    3. Attention deficit hyperactivity disorder (ADHD), combined type, mild  F90.2     4. Secondary neurodevelopmental disorder  F88 lamoTRIgine  (LAMICTAL ) 150 MG tablet    5. Panic disorder  F41.0 PARoxetine  (PAXIL ) 30 MG tablet    6. Grief  F43.21      Receiving Psychotherapy: Yes with Tony Hayes, [facility]  RECOMMENDATIONS:  PDMP reviewed.  No controlled substances listed. I provided 20 minutes of face to face time during this encounter, including time spent before and after the visit in records review, medical decision making, counseling pertinent to today's visit, and charting.   My condolences in the loss of his grandmother.  He is doing well with current medications so no changes need to be made.  Continued Lamictal  150 mg, 2 p.o. nightly. Continue Paxil  30 mg, 1 p.o. daily. Continue vitamins and supplements as per med list. Return in 6 months.  Tony Slocumb, PA-C

## 2024-03-27 ENCOUNTER — Encounter: Payer: Self-pay | Admitting: Family Medicine

## 2024-03-27 ENCOUNTER — Ambulatory Visit: Admitting: Family Medicine

## 2024-03-27 VITALS — BP 118/74 | HR 79 | Temp 97.9°F | Ht 70.0 in | Wt 228.2 lb

## 2024-03-27 DIAGNOSIS — E66811 Obesity, class 1: Secondary | ICD-10-CM

## 2024-03-27 DIAGNOSIS — F3170 Bipolar disorder, currently in remission, most recent episode unspecified: Secondary | ICD-10-CM | POA: Insufficient documentation

## 2024-03-27 DIAGNOSIS — H6123 Impacted cerumen, bilateral: Secondary | ICD-10-CM | POA: Diagnosis not present

## 2024-03-27 DIAGNOSIS — E6609 Other obesity due to excess calories: Secondary | ICD-10-CM | POA: Diagnosis not present

## 2024-03-27 DIAGNOSIS — Z6832 Body mass index (BMI) 32.0-32.9, adult: Secondary | ICD-10-CM | POA: Diagnosis not present

## 2024-03-27 NOTE — Assessment & Plan Note (Signed)
 Maximum weight: 252 lbs (02/15/2024) Current weight: 228 lbs Weight change since last visit: - 14 lbs Total weight loss: - 14 lbs (5.5 %)  Congratulated Tony Hayes on his weight loss so far. Encourage him to continue the effort, esp. with exercise. He could increase from power walking to jogging for this.

## 2024-03-27 NOTE — Progress Notes (Signed)
 [facility] [address] [place] [address] Dept: [phone] Dept Fax: [phone]  Office Visit  Subjective:    Patient ID: Tony Hayes, male    DOB: 08/06/1991, 33 y.o..   MRN: [id number]  Chief Complaint  Patient presents with   Ear Fullness    C/o having wax in ears.    History of Present Illness:  Patient is in today complaining of wax build-up in his ears. he has esp. noted a pressure sensation on the left.  Since his last visit, Tony Hayes has started exercising regularly. He is doing 75 min of power walking. He has also modified his diet. He is pleased with the weight loss so far.  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   Attention deficit hyperactivity disorder (ADHD), combined type, 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   Past Surgical History:  Procedure Laterality Date   CYSTOSCOPY WITH RETROGRADE PYELOGRAM, URETEROSCOPY AND STENT PLACEMENT Right 09/05/2014   Procedure: CYSTOSCOPY WITH RIGHT RETROGRADE PYELOGRAM AND STENT PLACEMENT;  Surgeon: Arlena LILLETTE Gal, MD;  Location: [facility];  Service: Urology;  Laterality: Right;   CYSTOSCOPY WITH RETROGRADE PYELOGRAM, URETEROSCOPY AND STENT PLACEMENT Right 10/21/2014   Procedure: CYSTOSCOPY WITH RIGHT RETROGRADE PYELOGRAM, RIGHT URETEROSCOPY AND  STONE EXTRACTION ,STENT  PLACEMENT;  Surgeon: Arlena LILLETTE Gal, MD;  Location: [facility];  Service: Urology;  Laterality: Right;   HOLMIUM LASER APPLICATION Right 10/21/2014   Procedure: HOLMIUM LASER ;  Surgeon: Arlena LILLETTE Gal, MD;  Location: [facility];  Service: Urology;  Laterality: Right;   ORCHIOPEXY     33 year of age   WISDOM TOOTH EXTRACTION     all removed at age 71   Family History  Adopted: Yes  Problem Relation Age of Onset   Drug abuse Mother    Outpatient Medications Prior to Visit  Medication Sig Dispense Refill   Cholecalciferol (VITAMIN D3) 50 MCG (2000 UT) CAPS Take by mouth.     lamoTRIgine  (LAMICTAL ) 150 MG tablet Take 2 tablets (300 mg total) by mouth at bedtime. 180 tablet 1   MAGNESIUM PO Take by mouth at bedtime as needed.     Multiple Vitamin (MULTIVITAMIN) tablet Take 1 tablet by mouth daily.     Omega-3 Fatty Acids (FISH OIL PO) Take by mouth.     PARoxetine  (PAXIL ) 30 MG tablet Take 1 tablet (30 mg total) by mouth at bedtime. 90 tablet 1   No facility-administered medications prior to visit.   No Known Allergies   Objective:   Today's Vitals   03/27/24 1056  BP: 118/74  Pulse: 79  Temp: 97.9 F (36.6 C)  TempSrc: Temporal  SpO2: 98%  Weight: 228 lb 3.2 oz (103.5 kg)  Height: 5' 10 (1.778 m)   Body mass index is 32.74 kg/m.   General: Well developed, well nourished. No acute distress. HEENT: Normocephalic, non-traumatic. Both EAC have wax, with complete impaction on the left.  Psych: Alert and oriented. Normal mood and affect.  Health Maintenance Due  Topic Date Due   HIV Screening  Never done   Hepatitis C Screening  Never done   HPV VACCINES (2 - Risk male 3-dose series) 02/23/2012   PROCEDURE- Ear Wax Removal Indication: Impacted ear wax  PARQ reviewed with patient. Verbal consent obtained. Flushed left ear with mixture of warm water  and hydrogen peroxide. Lighted curette used to remove wax from both ears. Patient tolerated procedure well.    Assessment  & Plan:   Problem List Items Addressed This Visit       Other   Class 1 obesity due to excess calories with body mass index (BMI) of 32.0 to 32.9 in adult   Maximum weight: 252 lbs (02/15/2024) Current weight: 228 lbs Weight change since last visit: - 14 lbs Total weight loss: - 14 lbs (5.5 %)  Congratulated Tony Hayes on his weight loss so far. Encourage him to continue the effort, esp. with exercise. He could increase from power walking to jogging for this.      Other Visit Diagnoses       Bilateral impacted cerumen    -  Primary   Wax removed as noted above.       Return if symptoms worsen or fail to improve.   Garnette CHRISTELLA Simpler, MD

## 2024-04-05 ENCOUNTER — Ambulatory Visit (INDEPENDENT_AMBULATORY_CARE_PROVIDER_SITE_OTHER): Admitting: Mental Health

## 2024-04-05 DIAGNOSIS — F411 Generalized anxiety disorder: Secondary | ICD-10-CM | POA: Diagnosis not present
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
## 2024-05-10 DIAGNOSIS — H1013 Acute atopic conjunctivitis, bilateral: Secondary | ICD-10-CM | POA: Diagnosis not present

## 2024-07-31 ENCOUNTER — Ambulatory Visit: Admitting: Mental Health

## 2024-08-07 DIAGNOSIS — Z23 Encounter for immunization: Secondary | ICD-10-CM | POA: Diagnosis not present

## 2024-08-13 ENCOUNTER — Ambulatory Visit: Admitting: Physician Assistant

## 2024-08-13 NOTE — Progress Notes (Signed)
 No show

## 2024-08-14 ENCOUNTER — Ambulatory Visit: Admitting: Mental Health

## 2024-08-14 DIAGNOSIS — F902 Attention-deficit hyperactivity disorder, combined type: Secondary | ICD-10-CM | POA: Diagnosis not present

## 2024-08-14 DIAGNOSIS — F411 Generalized anxiety disorder: Secondary | ICD-10-CM | POA: Diagnosis not present
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
## 2024-08-28 ENCOUNTER — Ambulatory Visit: Admitting: Mental Health

## 2024-08-28 DIAGNOSIS — F902 Attention-deficit hyperactivity disorder, combined type: Secondary | ICD-10-CM

## 2024-08-28 DIAGNOSIS — F411 Generalized anxiety disorder: Secondary | ICD-10-CM

## 2024-08-28 NOTE — Progress Notes (Signed)
°      [facility] Counselor/Therapist Progress Note  Patient ID: Tony Hayes, MRN: [id number],    Date: 08/28/2024  Time Spent: 50 minutes  Time in: 10:05am     time out10:55am  Treatment Type: Individual Therapy  Reported Symptoms: Depressed mood, anhedonia, anxiety    Mental Status Exam:    Appearance:    Casual     Behavior:   Appropriate  Motor:   WNL  Speech/Language:    Clear and Coherent  Affect:   Full range   Mood:   Euthymic  Thought process:   normal  Thought content:     WNL  Sensory/Perceptual disturbances:     none  Orientation:   x4  Attention:   Good  Concentration:   Good  Memory:   Intact  Fund of knowledge:    Consistent with age and development  Insight:     Good  Judgment:    Good  Impulse Control:   Good     Risk Assessment: Danger to Self:  No Self-injurious Behavior: No Danger to Others: No Duty to Warn:no Physical Aggression / Violence:No  Access to Firearms a concern: No  Gang Involvement:No   Subjective: Patient presents on time for today's session.  Assessed progress where patient shared how he has been managing his anger and anxiety well, making improvements.  He stated that he is trying to focus on his thinking, has noticed that he often has black-and-white thinking and is working on being more in the middle.  He stated that it was helpful when he went to church recently and the pastors message was 1 that left him thinking and considering how he reviews some aspects of his life and experiences.  He went on to share how he plans to apply this more in his life, instead of making quicker judgments about people, their intentions or other ways that he feels that he can make changes in this way with his thinking as it has resulted in his being less stressed, less angry specifically.  Outlets for stress management were also explored where he plans to take up playing guitar again.  He stated that he plans to purchase a guitar as he had told  his years ago and make time for this outlet.   Interventions: Cognitive Behavioral Therapy, Ego-Supportive, and Insight-Oriented  Diagnosis:   ICD-10-CM   1. Generalized anxiety disorder  F41.1     2. 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   Long-term goal:   Reduce overall level, frequency, and intensity anxiety and panic per client report for at least 3 consecutive months.  Short-term goal:  Identifying specific situations/setting that leads to increased feelings of anxiety and panic Verbally express understanding of the relationship between feelings of depression, anxiety and their impact on thinking patterns and behaviors. Verbalize an understanding of the role that distorted thinking plays in creating fears, excessive worry, and ruminations.  Increase confidence, feelings of higher self-worth   Lonni Fischer, [facility]

## 2024-09-03 ENCOUNTER — Other Ambulatory Visit: Payer: Self-pay | Admitting: Physician Assistant

## 2024-09-03 DIAGNOSIS — F88 Other disorders of psychological development: Secondary | ICD-10-CM

## 2024-09-03 DIAGNOSIS — F410 Panic disorder [episodic paroxysmal anxiety] without agoraphobia: Secondary | ICD-10-CM

## 2024-09-03 DIAGNOSIS — F340 Cyclothymic disorder: Secondary | ICD-10-CM

## 2024-09-03 DIAGNOSIS — F411 Generalized anxiety disorder: Secondary | ICD-10-CM

## 2024-09-03 MED ORDER — PAROXETINE HCL 30 MG PO TABS: 30.0000 mg | ORAL_TABLET | Freq: Every day | ORAL | 0 refills | Status: DC

## 2024-09-03 NOTE — Telephone Encounter (Signed)
 Mom called requesting new Rx for Lamotrigine  and Paxil  to [facility]. Apt 1/8

## 2024-09-20 ENCOUNTER — Encounter: Payer: Self-pay | Admitting: Physician Assistant

## 2024-09-20 ENCOUNTER — Ambulatory Visit: Admitting: Physician Assistant

## 2024-09-20 DIAGNOSIS — F331 Major depressive disorder, recurrent, moderate: Secondary | ICD-10-CM

## 2024-09-20 DIAGNOSIS — F411 Generalized anxiety disorder: Secondary | ICD-10-CM

## 2024-09-20 DIAGNOSIS — F340 Cyclothymic disorder: Secondary | ICD-10-CM

## 2024-09-20 DIAGNOSIS — F902 Attention-deficit hyperactivity disorder, combined type: Secondary | ICD-10-CM | POA: Diagnosis not present

## 2024-09-20 MED ORDER — FLUOXETINE HCL 40 MG PO CAPS: 40.0000 mg | ORAL_CAPSULE | Freq: Every day | ORAL | 1 refills | Status: DC

## 2024-09-20 NOTE — Progress Notes (Signed)
 "     [facility] Med Check  Patient ID: Tony Hayes,  MRN: [id number]  PCP: Thedora Garnette HERO, MD  Date of Evaluation: 09/20/2024 Time spent:25 minutes  Chief Complaint:  Chief Complaint   Follow-up    HISTORY/CURRENT STATUS: HPI  For routine 6 month med check. Accompanied by his Mom.   I feel like everything is dark around me. I'm ok in the house with my parents but as soon as I walk outside, I feel like Hades is falling from the sky. He has episodes where he's agitated, a few days ago, he felt really agitated, felt like he wanted to run and run until he couldn't walk anymore.  Anhedonia, appetite is fair, hygiene is ok, sleeps ok. Feels anxious with all that's going on in the world. No PA.  Able to focus/concentrate well. No mania, delirium, AH/VH.  Has passive SI, no plan but thinks things would be better 'if I wasn't here.' No HI.   Individual Medical History/ Review of Systems: Changes? :No   Past Psychiatric Medication Trials: Seroquel  Risperdal -Increased prolactin Abilify  Paxil  Effexor Citalopram Remeron- slurred speech, blurred vision, staggering Wellbutrin Lamictal  Valium  Intuniv Vyvanse Trazodone Propranolol   Allergies: Patient has no known allergies.  Current Medications:  Current Outpatient Medications:    Multiple Vitamin (MULTIVITAMIN) tablet, Take 1 tablet by mouth daily., Disp: , Rfl:    ARIPiprazole  (ABILIFY ) 5 MG tablet, Take 1 tablet (5 mg total) by mouth daily with breakfast., Disp: 30 tablet, Rfl: 0   FLUoxetine  (PROZAC ) 20 MG capsule, Take 1 capsule (20 mg total) by mouth daily., Disp: 30 capsule, Rfl: 0   hydrOXYzine  (ATARAX ) 25 MG tablet, Take 1 tablet (25 mg total) by mouth 3 (three) times daily as needed for anxiety., Disp: 30 tablet, Rfl: 0   lamoTRIgine  (LAMICTAL ) 200 MG tablet, Take 1 tablet (200 mg total) by mouth at bedtime., Disp: 30 tablet, Rfl: 0   nicotine  polacrilex (NICORETTE ) 2 MG gum, Take 1 each (2 mg total) by mouth as needed  for smoking cessation., Disp: 100 tablet, Rfl: 0 Medication Side Effects: none  Family Medical/ Social History: Changes?  No  MENTAL HEALTH EXAM:  There were no vitals taken for this visit.There is no height or weight on file to calculate BMI.  General Appearance: Casual and Well Groomed  Eye Contact:  Good  Speech:  Clear and Coherent and Normal Rate  Volume:  Normal  Mood:  Depressed  Affect:  Congruent  Thought Process:  Goal Directed and Descriptions of Associations: Circumstantial  Orientation:  Full (Time, Place, and Person)  Thought Content: Logical   Suicidal Thoughts:  Yes.  without intent/plan  Homicidal Thoughts:  No  Memory:  WNL  Judgement:  Good  Insight:  Good  Psychomotor Activity:  Normal  Concentration:  Concentration: Good and Attention Span: Good  Recall:  Good  Fund of Knowledge: Good  Language: Good  Assets:  Desire for Improvement Financial Resources/Insurance Housing Transportation Vocational/Educational  ADL's:  Intact  Cognition: WNL  Prognosis:  Good   DIAGNOSES:    ICD-10-CM   1. Major depressive disorder, recurrent episode, moderate (HCC)  F33.1     2. Generalized anxiety disorder  F41.1     3. Attention deficit hyperactivity disorder (ADHD), combined type, mild  F90.2       Receiving Psychotherapy: Yes  with Medford Fischer, [facility]  RECOMMENDATIONS:  PDMP reviewed.  No controlled substances listed. I provided approximately 25  minutes of face to face time during  this encounter, including time spent before and after the visit in records review, medical decision making, counseling pertinent to today's visit, and charting.   Discussed options for treatment. Recommend an antidep. Has never taken Prozac . Disc benefits, risks, SE and he and his Mom accept and would like to try it.   Contract for safety in place. Call the office on-call service, 988/hotline, 911, or present to [facility] or ER if any life-threatening psychiatric  crisis. Patient verbalizes understanding.   Start Prozac  20 mg, 1 daily.  Continued Lamictal  150 mg, 2 p.o. nightly. Continue vitamins and supplements as per med list. Continue counseling.  Return in 6 weeks.   Verneita Cooks, PA-C  "

## 2024-09-24 ENCOUNTER — Encounter (HOSPITAL_COMMUNITY): Payer: Self-pay | Admitting: Emergency Medicine

## 2024-09-24 ENCOUNTER — Other Ambulatory Visit: Payer: Self-pay

## 2024-09-24 ENCOUNTER — Emergency Department (HOSPITAL_COMMUNITY)
Admission: EM | Admit: 2024-09-24 | Discharge: 2024-09-25 | Disposition: A | Discharge status: Other Acute Inpatient Hospital | Source: Home / Self Care

## 2024-09-24 DIAGNOSIS — R440 Auditory hallucinations: Secondary | ICD-10-CM | POA: Insufficient documentation

## 2024-09-24 DIAGNOSIS — R45851 Suicidal ideations: Secondary | ICD-10-CM | POA: Insufficient documentation

## 2024-09-24 LAB — CBC
HCT: 43.6 % (ref 39.0–52.0)
Hemoglobin: 15.2 g/dL (ref 13.0–17.0)
MCH: 30.6 pg (ref 26.0–34.0)
MCHC: 34.9 g/dL (ref 30.0–36.0)
MCV: 87.7 fL (ref 80.0–100.0)
Platelets: 352 K/uL (ref 150–400)
RBC: 4.97 MIL/uL (ref 4.22–5.81)
RDW: 12.1 % (ref 11.5–15.5)
WBC: 10.7 K/uL — ABNORMAL HIGH (ref 4.0–10.5)
nRBC: 0 % (ref 0.0–0.2)

## 2024-09-24 LAB — COMPREHENSIVE METABOLIC PANEL WITH GFR
ALT: 15 U/L (ref 0–44)
AST: 34 U/L (ref 15–41)
Albumin: 4.8 g/dL (ref 3.5–5.0)
Alkaline Phosphatase: 81 U/L (ref 38–126)
Anion gap: 11 (ref 5–15)
BUN: 17 mg/dL (ref 6–20)
CO2: 23 mmol/L (ref 22–32)
Calcium: 10 mg/dL (ref 8.9–10.3)
Chloride: 102 mmol/L (ref 98–111)
Creatinine, Ser: 1.07 mg/dL (ref 0.61–1.24)
GFR, Estimated: >60 mL/min
Glucose, Bld: 111 mg/dL — ABNORMAL HIGH (ref 70–99)
Potassium: 4.1 mmol/L (ref 3.5–5.1)
Sodium: 135 mmol/L (ref 135–145)
Total Bilirubin: 0.4 mg/dL (ref 0.0–1.2)
Total Protein: 7.6 g/dL (ref 6.5–8.1)

## 2024-09-24 LAB — ETHANOL: Alcohol, Ethyl (B): <15 mg/dL

## 2024-09-24 MED ORDER — LAMOTRIGINE 100 MG PO TABS
300.0000 mg | ORAL_TABLET | Freq: Every day | ORAL | Status: DC
  Administered 2024-09-24: 300 mg via ORAL
  Filled 2024-09-24: qty 3

## 2024-09-24 MED ORDER — ACETAMINOPHEN 325 MG PO TABS: 650.0000 mg | ORAL_TABLET | ORAL | Status: DC | PRN
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
## 2024-09-24 NOTE — BH Assessment (Signed)
 Clinician messaged [name], RN: Tony Hayes. It's Tony Hayes with TTS. Is the pt able to engage in the assessment, if so the pt will need to be placed in a private room. Is the pt under IVC? Also is the pt medically cleared?   Clinician is awaiting response.   [name], MS, [facility], [facility] Triage Specialist [phone]
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
## 2024-09-25 ENCOUNTER — Telehealth: Payer: Self-pay | Admitting: Physician Assistant

## 2024-09-25 ENCOUNTER — Encounter (HOSPITAL_COMMUNITY): Payer: Self-pay | Admitting: Psychiatry

## 2024-09-25 ENCOUNTER — Inpatient Hospital Stay (HOSPITAL_COMMUNITY)
Admission: AD | Admit: 2024-09-25 | Discharge: 2024-10-01 | DRG: 885 | Disposition: A | Discharge status: Home / Self Care | Source: Intra-hospital | Attending: Student in an Organized Health Care Education/Training Program | Admitting: Student in an Organized Health Care Education/Training Program

## 2024-09-25 DIAGNOSIS — F1491 Cocaine use, unspecified, in remission: Secondary | ICD-10-CM | POA: Diagnosis present

## 2024-09-25 DIAGNOSIS — Z91411 Personal history of adult psychological abuse: Secondary | ICD-10-CM

## 2024-09-25 DIAGNOSIS — R45851 Suicidal ideations: Secondary | ICD-10-CM | POA: Diagnosis present

## 2024-09-25 DIAGNOSIS — F17290 Nicotine dependence, other tobacco product, uncomplicated: Secondary | ICD-10-CM | POA: Diagnosis present

## 2024-09-25 DIAGNOSIS — Z79899 Other long term (current) drug therapy: Secondary | ICD-10-CM | POA: Diagnosis not present

## 2024-09-25 DIAGNOSIS — F909 Attention-deficit hyperactivity disorder, unspecified type: Secondary | ICD-10-CM | POA: Diagnosis present

## 2024-09-25 DIAGNOSIS — F410 Panic disorder [episodic paroxysmal anxiety] without agoraphobia: Secondary | ICD-10-CM | POA: Diagnosis present

## 2024-09-25 DIAGNOSIS — Z818 Family history of other mental and behavioral disorders: Secondary | ICD-10-CM

## 2024-09-25 DIAGNOSIS — Z560 Unemployment, unspecified: Secondary | ICD-10-CM

## 2024-09-25 DIAGNOSIS — Z716 Tobacco abuse counseling: Secondary | ICD-10-CM | POA: Diagnosis not present

## 2024-09-25 DIAGNOSIS — F1220 Cannabis dependence, uncomplicated: Secondary | ICD-10-CM | POA: Diagnosis present

## 2024-09-25 DIAGNOSIS — Z8659 Personal history of other mental and behavioral disorders: Secondary | ICD-10-CM

## 2024-09-25 DIAGNOSIS — Z9889 Other specified postprocedural states: Secondary | ICD-10-CM | POA: Diagnosis not present

## 2024-09-25 DIAGNOSIS — Z9152 Personal history of nonsuicidal self-harm: Secondary | ICD-10-CM | POA: Diagnosis not present

## 2024-09-25 DIAGNOSIS — F411 Generalized anxiety disorder: Secondary | ICD-10-CM | POA: Diagnosis present

## 2024-09-25 DIAGNOSIS — Z813 Family history of other psychoactive substance abuse and dependence: Secondary | ICD-10-CM

## 2024-09-25 DIAGNOSIS — F3162 Bipolar disorder, current episode mixed, moderate: Principal | ICD-10-CM | POA: Diagnosis present

## 2024-09-25 DIAGNOSIS — G4700 Insomnia, unspecified: Secondary | ICD-10-CM | POA: Diagnosis present

## 2024-09-25 DIAGNOSIS — F1010 Alcohol abuse, uncomplicated: Secondary | ICD-10-CM | POA: Diagnosis present

## 2024-09-25 DIAGNOSIS — F329 Major depressive disorder, single episode, unspecified: Secondary | ICD-10-CM | POA: Diagnosis present

## 2024-09-25 DIAGNOSIS — F913 Oppositional defiant disorder: Secondary | ICD-10-CM | POA: Diagnosis present

## 2024-09-25 DIAGNOSIS — N189 Chronic kidney disease, unspecified: Secondary | ICD-10-CM | POA: Diagnosis present

## 2024-09-25 DIAGNOSIS — Z9151 Personal history of suicidal behavior: Secondary | ICD-10-CM

## 2024-09-25 DIAGNOSIS — Z87442 Personal history of urinary calculi: Secondary | ICD-10-CM

## 2024-09-25 DIAGNOSIS — Z91410 Personal history of adult physical and sexual abuse: Secondary | ICD-10-CM

## 2024-09-25 LAB — URINE DRUG SCREEN
Amphetamines: NEGATIVE
Barbiturates: NEGATIVE
Benzodiazepines: NEGATIVE
Cocaine: NEGATIVE
Fentanyl: NEGATIVE
Methadone Scn, Ur: NEGATIVE
Opiates: NEGATIVE
Tetrahydrocannabinol: POSITIVE — AB

## 2024-09-25 MED ORDER — ALUM & MAG HYDROXIDE-SIMETH 200-200-20 MG/5ML PO SUSP: 30.0000 mL | ORAL | Status: DC | PRN

## 2024-09-25 MED ORDER — LORAZEPAM 2 MG/ML IJ SOLN: 2.0000 mg | Freq: Three times a day (TID) | INTRAMUSCULAR | Status: DC | PRN

## 2024-09-25 MED ORDER — FLUOXETINE HCL 20 MG PO CAPS
40.0000 mg | ORAL_CAPSULE | Freq: Every day | ORAL | Status: DC
  Administered 2024-09-25 – 2024-09-26 (×2): 40 mg via ORAL
  Filled 2024-09-25 (×2): qty 2

## 2024-09-25 MED ORDER — DIPHENHYDRAMINE HCL 25 MG PO CAPS
50.0000 mg | ORAL_CAPSULE | Freq: Three times a day (TID) | ORAL | Status: DC | PRN
  Administered 2024-09-25: 50 mg via ORAL
  Filled 2024-09-25: qty 2

## 2024-09-25 MED ORDER — HALOPERIDOL 5 MG PO TABS
5.0000 mg | ORAL_TABLET | Freq: Three times a day (TID) | ORAL | Status: DC | PRN
  Administered 2024-09-25: 5 mg via ORAL
  Filled 2024-09-25: qty 1

## 2024-09-25 MED ORDER — PAROXETINE HCL 20 MG PO TABS
30.0000 mg | ORAL_TABLET | Freq: Every day | ORAL | Status: DC
  Administered 2024-09-25: 30 mg via ORAL
  Filled 2024-09-25: qty 1

## 2024-09-25 MED ORDER — LAMOTRIGINE 150 MG PO TABS
300.0000 mg | ORAL_TABLET | Freq: Every day | ORAL | Status: DC
  Administered 2024-09-25: 300 mg via ORAL
  Filled 2024-09-25: qty 2

## 2024-09-25 MED ORDER — DIPHENHYDRAMINE HCL 50 MG/ML IJ SOLN: 50.0000 mg | Freq: Three times a day (TID) | INTRAMUSCULAR | Status: DC | PRN

## 2024-09-25 MED ORDER — NICOTINE POLACRILEX 2 MG MT GUM
2.0000 mg | CHEWING_GUM | OROMUCOSAL | Status: DC | PRN
  Administered 2024-09-26 (×2): 2 mg via ORAL

## 2024-09-25 MED ORDER — ACETAMINOPHEN 325 MG PO TABS
650.0000 mg | ORAL_TABLET | Freq: Four times a day (QID) | ORAL | Status: DC | PRN
  Administered 2024-09-27 – 2024-09-28 (×2): 650 mg via ORAL
  Filled 2024-09-25 (×2): qty 2

## 2024-09-25 MED ORDER — HALOPERIDOL LACTATE 5 MG/ML IJ SOLN: 10.0000 mg | Freq: Three times a day (TID) | INTRAMUSCULAR | Status: DC | PRN

## 2024-09-25 MED ORDER — MAGNESIUM HYDROXIDE 400 MG/5ML PO SUSP: 30.0000 mL | Freq: Every day | ORAL | Status: DC | PRN

## 2024-09-25 MED ORDER — HALOPERIDOL LACTATE 5 MG/ML IJ SOLN: 5.0000 mg | Freq: Three times a day (TID) | INTRAMUSCULAR | Status: DC | PRN

## 2024-09-25 NOTE — Plan of Care (Signed)
  Problem: Education: Goal: Knowledge of [place] [facility] Education information/materials will improve Outcome: Progressing   Problem: Activity: Goal: Interest or engagement in activities will improve Outcome: Progressing   

## 2024-09-25 NOTE — Progress Notes (Signed)
" °   09/25/24 1500  Psych Admission Type (Psych Patients Only)  Admission Status Involuntary  Psychosocial Assessment  Patient Complaints Sadness;Worrying  Eye Contact Fair  Facial Expression Anxious;Sad  Affect Depressed;Preoccupied  Speech Logical/coherent  Interaction Assertive  Motor Activity Fidgety  Appearance/Hygiene Unremarkable  Behavior Characteristics Cooperative;Anxious  Mood Depressed;Anxious;Preoccupied  Thought Process  Coherency WDL  Content Blaming self  Delusions None reported or observed  Perception WDL  Hallucination None reported or observed  Judgment Limited  Confusion Mild  Danger to Self  Current suicidal ideation? Denies  Danger to Others  Danger to Others None reported or observed    "

## 2024-09-25 NOTE — Telephone Encounter (Signed)
 Please see message from mom.  I have not viewed note, but dx viewable says SI.

## 2024-09-25 NOTE — [facility] Group Notes (Signed)
 Adult Psychoeducational Group Note  Date:  09/25/2024 Time:  8:45 PM  Group Topic/Focus:  Wrap-Up Group:   The focus of this group is to help patients review their daily goal of treatment and discuss progress on daily workbooks.  Participation Level:  Active  Participation Quality:  Attentive  Affect:  Appropriate  Cognitive:  Alert  Insight: Appropriate  Engagement in Group:  Engaged  Modes of Intervention:  Discussion  Additional Comments:  Patient attended and participated in the Wrap-up group.  Tony Hayes 09/25/2024, 8:45 PM

## 2024-09-25 NOTE — ED Notes (Signed)
 Called report to Sabina at [facility]
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
## 2024-09-25 NOTE — Progress Notes (Addendum)
 Pt has been accepted to [facility] on 09/25/2024. Bed assignment:405-2  Pt meets inpatient criteria per Richerd Ivans, NP  Attending Physician will be, Dr Raliegh  Report can be called to: --Adult unit: [phone]  Pt can arrive after - RN will update  Care Team Notified: [facility], Cherylynn Ernst, RN, April Wilson, Paramedic,

## 2024-09-25 NOTE — Progress Notes (Signed)
 1815 patient received PO PRN agitation protocol per MAR due to restlessness and stating I just can't calm down I feel like I'm going to lose it. Patient was cooperative during med administration.

## 2024-09-25 NOTE — Progress Notes (Signed)
(  Sleep Hours) -6.25  (Any PRNs that were needed, meds refused, or side effects to meds)- none  (Any disturbances and when (visitation, over night)-none  (Concerns raised by the patient)- none  (SI/HI/AVH)- denies

## 2024-09-25 NOTE — ED Provider Notes (Signed)
 Emergency Medicine Observation Re-evaluation Note  Tony Hayes is a 34 y.o. male, seen on rounds today.  Pt initially presented to the ED for complaints of IVC Currently, the patient is sleeping. No acute events overnight.   Physical Exam  BP 118/76   Pulse 72   Temp 97.7 F (36.5 C) (Oral)   Resp 17   SpO2 97%  Physical Exam General: NAD Lungs: No respiratory distress Psych: Calm, cooperative   ED Course / MDM  EKG:   I have reviewed the labs performed to date as well as medications administered while in observation.  Recent changes in the last 24 hours include none  Plan  Current plan is for inpatient psych placement     Gennaro Duwaine CROME, DO 09/25/24 [id number]

## 2024-09-25 NOTE — Plan of Care (Signed)
   Problem: Education: Goal: Emotional status will improve Outcome: Progressing Goal: Mental status will improve Outcome: Progressing Goal: Verbalization of understanding the information provided will improve Outcome: Progressing
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
## 2024-09-25 NOTE — Telephone Encounter (Signed)
 My note from 09/20/2024 is incomplete but our plan was to cross taper the Paxil  (weaning off) and start him on Prozac  at the same time. Follow the advice of his inpatient provider, even if on both SSRIs it's fine w/ monitoring until the Prozac  becomes therapeutic.

## 2024-09-25 NOTE — Group Note (Signed)
 Date:  09/25/2024 Time:  5:09 PM  Group Topic/Focus:  Dimensions of Wellness:   The focus of this group is to introduce the topic of wellness and discuss the role each dimension of wellness plays in total health.    Participation Level:  Active  Participation Quality:  Appropriate  Affect:  Appropriate  Cognitive:  Appropriate  Insight: Appropriate  Engagement in Group:  Engaged  Modes of Intervention:  Discussion   Tony Hayes 09/25/2024, 5:09 PM

## 2024-09-26 ENCOUNTER — Encounter (HOSPITAL_COMMUNITY): Payer: Self-pay

## 2024-09-26 DIAGNOSIS — F1220 Cannabis dependence, uncomplicated: Secondary | ICD-10-CM

## 2024-09-26 DIAGNOSIS — F3162 Bipolar disorder, current episode mixed, moderate: Principal | ICD-10-CM

## 2024-09-26 MED ORDER — PAROXETINE HCL 10 MG PO TABS
10.0000 mg | ORAL_TABLET | Freq: Every day | ORAL | Status: AC
  Administered 2024-09-27: 10 mg via ORAL
  Filled 2024-09-26: qty 1

## 2024-09-26 MED ORDER — LAMOTRIGINE 100 MG PO TABS
200.0000 mg | ORAL_TABLET | Freq: Every day | ORAL | Status: DC
  Administered 2024-09-26 – 2024-09-30 (×5): 200 mg via ORAL
  Filled 2024-09-26 (×6): qty 2

## 2024-09-26 MED ORDER — FLUOXETINE HCL 20 MG PO CAPS
20.0000 mg | ORAL_CAPSULE | Freq: Every day | ORAL | Status: DC
  Administered 2024-09-27 – 2024-10-01 (×5): 20 mg via ORAL
  Filled 2024-09-26 (×5): qty 1

## 2024-09-26 MED ORDER — PAROXETINE HCL 20 MG PO TABS
20.0000 mg | ORAL_TABLET | Freq: Every day | ORAL | Status: AC
  Administered 2024-09-26: 20 mg via ORAL
  Filled 2024-09-26: qty 1

## 2024-09-26 MED ORDER — OXCARBAZEPINE 150 MG PO TABS: 150.0000 mg | ORAL_TABLET | Freq: Two times a day (BID) | ORAL | Status: DC

## 2024-09-26 MED ORDER — LAMOTRIGINE 25 MG PO TABS: 50.0000 mg | ORAL_TABLET | Freq: Every day | ORAL | Status: DC

## 2024-09-26 MED ORDER — HYDROXYZINE HCL 25 MG PO TABS
25.0000 mg | ORAL_TABLET | Freq: Three times a day (TID) | ORAL | Status: DC | PRN
  Administered 2024-09-26 – 2024-09-30 (×5): 25 mg via ORAL
  Filled 2024-09-26 (×4): qty 1

## 2024-09-26 MED ORDER — LAMOTRIGINE 150 MG PO TABS: 150.0000 mg | ORAL_TABLET | Freq: Every day | ORAL | Status: DC

## 2024-09-26 MED ORDER — ARIPIPRAZOLE 5 MG PO TABS
5.0000 mg | ORAL_TABLET | Freq: Every day | ORAL | Status: DC
  Administered 2024-09-27 – 2024-10-01 (×5): 5 mg via ORAL
  Filled 2024-09-26 (×5): qty 1

## 2024-09-26 MED ORDER — LAMOTRIGINE 100 MG PO TABS: 100.0000 mg | ORAL_TABLET | Freq: Every day | ORAL | Status: DC
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
## 2024-09-26 NOTE — Group Note (Signed)
 Recreation Therapy Group Note   Group Topic:Communication  Group Date: 09/26/2024 Start Time: 0940 End Time: 1005 Facilitators: Zoltan Genest-McCall, LRT,CTRS Location: [address] Dayroom   Group Topic: Communication, Team Building, Problem Solving  Goal Area(s) Addresses:  Patient will effectively work with peer towards shared goal.  Patient will identify skills used to make activity successful.  Patient will identify how skills used during activity can be applied to reach post d/c goals.   Behavioral Response:   Intervention: STEM Activity- Glass Blower/designer  Activity: Tallest [employer]. In teams of 5-6, patients were given 11 craft pipe cleaners. Using the materials provided, patients were instructed to compete again the opposing team(s) to build the tallest free-standing structure from floor level. The activity was timed; difficulty increased by clinical research associate as production designer, theatre/television/film continued.  Systematically resources were removed with additional directions for example, placing one arm behind their back, working in silence, and shape stipulations. LRT facilitated post-activity discussion reviewing team processes and necessary communication skills involved in completion. Patients were encouraged to reflect how the skills utilized, or not utilized, in this activity can be incorporated to positively impact support systems post discharge.  Education: Pharmacist, Community, Scientist, Physiological, Discharge Planning   Education Outcome: Acknowledges education/In group clarification offered/Needs additional education.    Affect/Mood: N/A   Participation Level: Did not attend    Clinical Observations/Individualized Feedback:      Plan: Continue to engage patient in RT group sessions 2-3x/week.   Deziyah Arvin-McCall, LRT,CTRS 09/26/2024 1:27 PM

## 2024-09-26 NOTE — Telephone Encounter (Signed)
 Reviewed Teresa's note with mother. She said that makes her feel better, as they have him on both medications and she was worried about it being too much.  Mom had some concerns about living arrangement after discharge. I told her to ask for a social work consult and they could help her/them with that. He is in the old [facility] facility and unsure if that is considered part of [facility] or not as far as the DPR. Told mom that they could see our notes and we can see their notes, and that they could always call us  if there were any questions/concerns.

## 2024-09-26 NOTE — Plan of Care (Signed)
   Problem: Education: Goal: Knowledge of [facility] Education information/materials will improve Outcome: Progressing Goal: Emotional status will improve Outcome: Progressing Goal: Mental status will improve Outcome: Progressing Goal: Verbalization of understanding the information provided will improve Outcome: Progressing

## 2024-09-26 NOTE — Group Note (Signed)
 Date:  09/26/2024 Time:  1:32 PM  Group Topic/Focus:  Spirituality:   The focus of this group is to discuss how one's spirituality can aide in recovery.    Participation Level:  Attended  Shanea Karney 09/26/2024, 1:32 PM

## 2024-09-26 NOTE — [facility] Group Notes (Signed)
 Adult Psychoeducational Group Note  Date:  09/26/2024 Time:  9:47 PM  Group Topic/Focus:  Wrap-Up Group:   The focus of this group is to help patients review their daily goal of treatment and discuss progress on daily workbooks.  Participation Level:  Active  Participation Quality:  Appropriate  Affect:  Appropriate  Cognitive:  Appropriate  Insight: Appropriate  Engagement in Group:  Engaged  Modes of Intervention:  Discussion  Additional Comments:  Mavis said his day 9. Goal forr today find new coping skills. Helpful coping skills breathing methods counting favorite part of the day afternoon.   Lang Drilling Long 09/26/2024, 9:47 PM
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
## 2024-09-26 NOTE — BH IP Treatment Plan (Signed)
 Interdisciplinary Treatment and Diagnostic Plan Update  09/26/2024 Time of Session: 1015AM Tony Hayes MRN: [id number]  Principal Diagnosis: MDD (major depressive disorder)  Secondary Diagnoses: Principal Problem:   MDD (major depressive disorder)   Current Medications:  Current Facility-Administered Medications  Medication Dose Route Frequency Provider Last Rate Last Admin   acetaminophen  (TYLENOL ) tablet 650 mg  650 mg Oral Q6H PRN Motley-Mangrum, Jadeka A, PMHNP       alum & mag hydroxide-simeth (MAALOX/MYLANTA) 200-200-20 MG/5ML suspension 30 mL  30 mL Oral Q4H PRN Motley-Mangrum, Jadeka A, PMHNP       haloperidol  (HALDOL ) tablet 5 mg  5 mg Oral TID PRN Motley-Mangrum, Jadeka A, PMHNP   5 mg at 09/25/24 1815   And   diphenhydrAMINE  (BENADRYL ) capsule 50 mg  50 mg Oral TID PRN Motley-Mangrum, Jadeka A, PMHNP   50 mg at 09/25/24 1815   haloperidol  lactate (HALDOL ) injection 5 mg  5 mg Intramuscular TID PRN Motley-Mangrum, Jadeka A, PMHNP       And   diphenhydrAMINE  (BENADRYL ) injection 50 mg  50 mg Intramuscular TID PRN Motley-Mangrum, Jadeka A, PMHNP       And   LORazepam  (ATIVAN ) injection 2 mg  2 mg Intramuscular TID PRN Motley-Mangrum, Jadeka A, PMHNP       haloperidol  lactate (HALDOL ) injection 10 mg  10 mg Intramuscular TID PRN Motley-Mangrum, Jadeka A, PMHNP       And   diphenhydrAMINE  (BENADRYL ) injection 50 mg  50 mg Intramuscular TID PRN Motley-Mangrum, Jadeka A, PMHNP       And   LORazepam  (ATIVAN ) injection 2 mg  2 mg Intramuscular TID PRN Motley-Mangrum, Jadeka A, PMHNP       FLUoxetine  (PROZAC ) capsule 40 mg  40 mg Oral Daily Motley-Mangrum, Jadeka A, PMHNP   40 mg at 09/26/24 0748   lamoTRIgine  (LAMICTAL ) tablet 300 mg  300 mg Oral QHS Motley-Mangrum, Jadeka A, PMHNP   300 mg at 09/25/24 2122   magnesium  hydroxide (MILK OF MAGNESIA) suspension 30 mL  30 mL Oral Daily PRN Motley-Mangrum, Jadeka A, PMHNP       nicotine  polacrilex (NICORETTE ) gum 2 mg  2 mg Oral  PRN Pashayan, Alexander S, DO   2 mg at 09/26/24 1241   PARoxetine  (PAXIL ) tablet 30 mg  30 mg Oral QHS Motley-Mangrum, Jadeka A, PMHNP   30 mg at 09/25/24 2122   PTA Medications: Medications Prior to Admission  Medication Sig Dispense Refill Last Dose/Taking   FLUoxetine  (PROZAC ) 40 MG capsule Take 1 capsule (40 mg total) by mouth daily. 30 capsule 1    lamoTRIgine  (LAMICTAL ) 150 MG tablet Take 2 tablets (300 mg total) by mouth at bedtime 180 tablet 0    MAGNESIUM  PO Take by mouth at bedtime as needed. (Patient not taking: Reported on 09/20/2024)      Multiple Vitamin (MULTIVITAMIN) tablet Take 1 tablet by mouth daily.      PARoxetine  (PAXIL ) 30 MG tablet Take 1 tablet (30 mg total) by mouth at bedtime. 90 tablet 0     Patient Stressors: Marital or family conflict   Medication change or noncompliance    Patient Strengths: Average or above average intelligence  Capable of independent living   Treatment Modalities: Medication Management, Group therapy, Case management,  1 to 1 session with clinician, Psychoeducation, Recreational therapy.   Physician Treatment Plan for Primary Diagnosis: MDD (major depressive disorder) Long Term Goal(s): Improvement in symptoms so as ready for discharge   Short Term Goals: Ability  to identify changes in lifestyle to reduce recurrence of condition will improve Ability to verbalize feelings will improve Ability to disclose and discuss suicidal ideas Ability to demonstrate self-control will improve Ability to identify and develop effective coping behaviors will improve Ability to maintain clinical measurements within normal limits will improve Compliance with prescribed medications will improve Ability to identify triggers associated with substance abuse/mental health issues will improve  Medication Management: Evaluate patient's response, side effects, and tolerance of medication regimen.  Therapeutic Interventions: 1 to 1 sessions, Unit Group  sessions and Medication administration.  Evaluation of Outcomes: Not Progressing  Physician Treatment Plan for Secondary Diagnosis: Principal Problem:   MDD (major depressive disorder)  Long Term Goal(s): Improvement in symptoms so as ready for discharge   Short Term Goals: Ability to identify changes in lifestyle to reduce recurrence of condition will improve Ability to verbalize feelings will improve Ability to disclose and discuss suicidal ideas Ability to demonstrate self-control will improve Ability to identify and develop effective coping behaviors will improve Ability to maintain clinical measurements within normal limits will improve Compliance with prescribed medications will improve Ability to identify triggers associated with substance abuse/mental health issues will improve     Medication Management: Evaluate patient's response, side effects, and tolerance of medication regimen.  Therapeutic Interventions: 1 to 1 sessions, Unit Group sessions and Medication administration.  Evaluation of Outcomes: Not Progressing   RN Treatment Plan for Primary Diagnosis: MDD (major depressive disorder) Long Term Goal(s): Knowledge of disease and therapeutic regimen to maintain health will improve  Short Term Goals: Ability to remain free from injury will improve, Ability to verbalize frustration and anger appropriately will improve, Ability to demonstrate self-control, Ability to participate in decision making will improve, Ability to verbalize feelings will improve, Ability to disclose and discuss suicidal ideas, Ability to identify and develop effective coping behaviors will improve, and Compliance with prescribed medications will improve  Medication Management: RN will administer medications as ordered by provider, will assess and evaluate patient's response and provide education to patient for prescribed medication. RN will report any adverse and/or side effects to prescribing  provider.  Therapeutic Interventions: 1 on 1 counseling sessions, Psychoeducation, Medication administration, Evaluate responses to treatment, Monitor vital signs and CBGs as ordered, Perform/monitor CIWA, COWS, AIMS and Fall Risk screenings as ordered, Perform wound care treatments as ordered.  Evaluation of Outcomes: Not Progressing   LCSW Treatment Plan for Primary Diagnosis: MDD (major depressive disorder) Long Term Goal(s): Safe transition to appropriate next level of care at discharge, Engage patient in therapeutic group addressing interpersonal concerns.  Short Term Goals: Engage patient in aftercare planning with referrals and resources, Increase social support, Increase ability to appropriately verbalize feelings, Increase emotional regulation, Facilitate acceptance of mental health diagnosis and concerns, Facilitate patient progression through stages of change regarding substance use diagnoses and concerns, Identify triggers associated with mental health/substance abuse issues, and Increase skills for wellness and recovery  Therapeutic Interventions: Assess for all discharge needs, 1 to 1 time with Social worker, Explore available resources and support systems, Assess for adequacy in community support network, Educate family and significant other(s) on suicide prevention, Complete Psychosocial Assessment, Interpersonal group therapy.  Evaluation of Outcomes: Not Progressing   Progress in Treatment: Attending groups: Yes. Participating in groups: Yes. Taking medication as prescribed: Yes. Toleration medication: Yes. Family/Significant other contact made: No, will contact:  Mother, Veronica Guerrant [phone] Patient understands diagnosis: Yes. Discussing patient identified problems/goals with staff: Yes. Medical problems stabilized or resolved: Yes.  Denies suicidal/homicidal ideation: Yes. Issues/concerns per patient self-inventory: No.  New problem(s) identified: No, Describe:   none  New Short Term/Long Term Goal(s): detox, medication management for mood stabilization; elimination of SI thoughts; development of comprehensive mental wellness/sobriety plan   Patient Goals:  Find a replacement for Kratom, step back and examine my mental health  Discharge Plan or Barriers: Patient recently admitted. CSW will continue to follow and assess for appropriate referrals and possible discharge planning.    Reason for Continuation of Hospitalization: Depression Medication stabilization Suicidal ideation Withdrawal symptoms  Estimated Length of Stay: 5-7 days  Last 3 [place] Suicide Severity Risk Score: Flowsheet Row [facility]Current[facility]24/2024 [facility] at [facility]  C-SSRS RISK CATEGORY High Risk High Risk    Last [facility] 2/9 Scores:    02/15/2024    8:26 AM 05/10/2023   10:00 AM  Depression screen PHQ 2/9  Decreased Interest 1 1  Down, Depressed, Hopeless 1 1  PHQ - 2 Score 2 2  Altered sleeping 1 3  Tired, decreased energy 1 3  Change in appetite 3 3  Feeling bad or failure about yourself  3 0  Trouble concentrating 1 3  Moving slowly or fidgety/restless 2 3  Suicidal thoughts 2 0  PHQ-9 Score 15  17   Difficult doing work/chores Somewhat difficult Somewhat difficult     Data saved with a previous flowsheet row definition    Scribe for Treatment Team: Jenkins LULLA Primer, LCSWA 09/26/2024 1:28 PM
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
## 2024-09-26 NOTE — Group Note (Signed)
 Date:  09/26/2024 Time:  9:51 AM  Group Topic/Focus:  Goals Group:   The focus of this group is to help patients establish daily goals to achieve during treatment and discuss how the patient can incorporate goal setting into their daily lives to aide in recovery. Orientation:   The focus of this group is to educate the patient on the purpose and policies of crisis stabilization and provide a format to answer questions about their admission.  The group details unit policies and expectations of patients while admitted.    Participation Level:  Active  Participation Quality:  Appropriate  Affect:  Appropriate  Cognitive:  Appropriate  Insight: Appropriate  Engagement in Group:  Engaged  Modes of Intervention:  Activity  Additional Comments:  Pt attended group and was engaged.  Shahana Capes 09/26/2024, 9:51 AM

## 2024-09-26 NOTE — Group Note (Signed)
 Date:  09/26/2024 Time:  10:16 AM  Group Topic/Focus:  Recreational Therapy    Participation Level:  Did Not Attend   Tony Hayes 09/26/2024, 10:16 AM

## 2024-09-26 NOTE — Group Note (Deleted)
 Date:  09/26/2024 Time:  9:24 AM  Group Topic/Focus:  Goals Group:   The focus of this group is to help patients establish daily goals to achieve during treatment and discuss how the patient can incorporate goal setting into their daily lives to aide in recovery. Orientation:   The focus of this group is to educate the patient on the purpose and policies of crisis stabilization and provide a format to answer questions about their admission.  The group details unit policies and expectations of patients while admitted.     Participation Level:  {BHH PARTICIPATION OZCZO:77735}  Participation Quality:  {BHH PARTICIPATION QUALITY:22265}  Affect:  {BHH AFFECT:22266}  Cognitive:  {[facility] COGNITIVE:22267}  Insight: {BHH Insight2:20797}  Engagement in Group:  {BHH ENGAGEMENT IN HMNLE:77731}  Modes of Intervention:  {BHH MODES OF INTERVENTION:22269}  Additional Comments:  ***  Tony Hayes 09/26/2024, 9:24 AM

## 2024-09-26 NOTE — Progress Notes (Signed)
" °   09/26/24 0800  Psych Admission Type (Psych Patients Only)  Admission Status Involuntary  Psychosocial Assessment  Patient Complaints Sadness;Anxiety;Worrying  Eye Contact Fair  Facial Expression Anxious  Affect Preoccupied;Depressed  Speech Logical/coherent  Interaction Assertive  Motor Activity Fidgety  Appearance/Hygiene Unremarkable  Behavior Characteristics Cooperative;Appropriate to situation  Mood Depressed;Anxious  Thought Process  Coherency WDL  Content Blaming self  Delusions None reported or observed  Perception WDL  Hallucination None reported or observed  Judgment Limited  Confusion None  Danger to Self  Current suicidal ideation? Denies  Danger to Others  Danger to Others None reported or observed    "

## 2024-09-26 NOTE — Group Note (Signed)
 Date:  09/26/2024 Time:  5:18 PM  Group Topic/Focus:  Managing Feelings:   The focus of this group is to identify what feelings patients have difficulty handling and develop a plan to handle them in a healthier way upon discharge. Overcoming Stress:   The focus of this group is to define stress and help patients assess their triggers.    Participation Level:  Active  Participation Quality:  Appropriate  Affect:  Appropriate  Cognitive:  Alert  Insight: Appropriate  Engagement in Group:  Engaged  Modes of Intervention:  Activity  Additional Comments:    [name] [name] 09/26/2024, 5:18 PM

## 2024-09-27 ENCOUNTER — Ambulatory Visit: Admitting: Mental Health

## 2024-09-27 LAB — HEMOGLOBIN A1C
Hgb A1c MFr Bld: 5.6 % (ref 4.8–5.6)
Mean Plasma Glucose: 114.02 mg/dL

## 2024-09-27 LAB — VITAMIN D 25 HYDROXY (VIT D DEFICIENCY, FRACTURES): Vit D, 25-Hydroxy: 35.6 ng/mL (ref 30–100)

## 2024-09-27 LAB — LIPID PANEL
Cholesterol: 163 mg/dL (ref 0–200)
HDL: 56 mg/dL
LDL Cholesterol: 88 mg/dL (ref 0–99)
Total CHOL/HDL Ratio: 2.9 ratio
Triglycerides: 96 mg/dL
VLDL: 19 mg/dL (ref 0–40)

## 2024-09-27 NOTE — Group Note (Signed)
 Date:  09/27/2024 Time:  10:05 AM  Group Topic/Focus: Nutrition Group  For adult patients, mental health is strongly supported by adequate intake of key nutrient groups that maintain brain function and emotional regulation. Complex carbohydrates provide a steady source of energy and support serotonin production, while proteins supply essential amino acids needed for neurotransmitters such as dopamine and serotonin. Healthy fats, particularly omega-3 fatty acids, are vital for brain cell membrane integrity and cognitive function. Micronutrients including B-complex vitamins, vitamin D , magnesium , zinc, and iron play critical roles in mood regulation, stress response, and neural signaling, with deficiencies often linked to depression and anxiety. Antioxidants from fruits and vegetables help protect the brain from oxidative stress, and dietary fiber along with probiotics supports the gut-brain axis, which influences mood and behavior. Adequate hydration further supports concentration, energy levels, and emotional stability.    Participation Level:  Active   Joreen Swearingin M Clarion Mooneyhan 09/27/2024, 10:05 AM

## 2024-09-27 NOTE — Plan of Care (Signed)
  Problem: Education: Goal: Mental status will improve Outcome: Progressing   

## 2024-09-27 NOTE — Plan of Care (Signed)

## 2024-09-27 NOTE — Group Note (Signed)
 Date:  09/27/2024 Time:  2:44 PM  Group Topic/Focus: Social work Anger is a normal and natural emotion, but when it is not understood or managed well, it can negatively affect our mental health, relationships, and daily functioning. In this group, we explore how anger often serves as a signal that something feels unfair, threatening, or overwhelming. By learning to recognize early warning signs--such as physical tension, racing thoughts, or irritability--patients can begin to pause before reacting. Anger management skills like deep breathing, grounding techniques, healthy communication, and problem-solving help individuals express their feelings in safer and more constructive ways. Developing these skills empowers patients to gain better emotional control, reduce conflict, and improve overall well-being.    Participation Level:  Active   Tony Hayes 09/27/2024, 2:44 PM

## 2024-09-27 NOTE — B[facility] Group Notes (Signed)
 Adult Psychoeducational Group Note  Date:  09/27/2024 Time:  8:52 PM  Group Topic/Focus:  Wrap-Up Group:   The focus of this group is to help patients review their daily goal of treatment and discuss progress on daily workbooks.  Participation Level:  Active  Participation Quality:  Attentive  Affect:  Appropriate  Cognitive:  Alert  Insight: Appropriate  Engagement in Group:  Engaged  Modes of Intervention:  Discussion  Additional Comments:  Patient attended and participated in the Wrap-up group.  Tony Hayes 09/27/2024, 8:52 PM
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
## 2024-09-27 NOTE — Group Note (Signed)
 Date:  09/27/2024 Time:  4:45 PM  Group Topic/Focus: occupational therapy Grounding techniques in occupational therapy help adult mental health patients manage stress, anxiety, and emotional distress by bringing attention to the present moment. These techniques use sensory input, physical movement, breathing, or cognitive focus to support emotional regulation and improve participation in daily activities. By integrating grounding strategies into meaningful occupations and routines, occupational therapists promote coping skills, independence, and overall mental well-being.     Participation Level:  Active Dolores HERO Tashayla Therien 09/27/2024, 4:45 PM

## 2024-09-27 NOTE — [facility] Suicide Risk Assessment (Signed)
 [facility] INPATIENT:  Family/Significant Other Suicide Prevention Education  Suicide Prevention Education:  Education Completed; Mother, Alyx Gee [phone],  (name of family member/significant other) has been identified by the patient as the family member/significant other with whom the patient will be residing, and identified as the person(s) who will aid the patient in the event of a mental health crisis (suicidal ideations/suicide attempt).  With written consent from the patient, the family member/significant other has been provided the following suicide prevention education, prior to the and/or following the discharge of the patient.  The suicide prevention education provided includes the following: Suicide risk factors Suicide prevention and interventions National Suicide Hotline telephone number [facility] assessment telephone number [facility] Emergency Assistance 911 [facility] and/or Residential Mobile Crisis Unit telephone number  Request made of family/significant other to: Remove weapons (e.g., guns, rifles, knives), all items previously/currently identified as safety concern.   Remove drugs/medications (over-the-counter, prescriptions, illicit drugs), all items previously/currently identified as a safety concern.  The family member/significant other verbalizes understanding of the suicide prevention education information provided.  The family member/significant other agrees to remove the items of safety concern listed above.  Roselyn GORMAN Lento 09/27/2024, 11:24 AM

## 2024-09-27 NOTE — Group Note (Signed)
 Date:  09/27/2024 Time:  3:09 PM  Group Topic/Focus:Emotional Wellness Emotional wellness involves understanding, expressing, and managing our emotions in healthy ways. It means being aware of how feelings like stress, anger, sadness, or happiness affect our thoughts and behaviors. Practicing emotional wellness helps individuals cope with challenges, build resilience, and maintain positive relationships. By using healthy coping skills--such as self-reflection, relaxation techniques, and seeking support--patients can improve emotional balance, enhance self-esteem, and support their overall mental health.    Participation Level:  Did Not Attend   Tony Hayes 09/27/2024, 3:09 PM

## 2024-09-27 NOTE — Group Note (Signed)
 LCSW Group Therapy Note   Group Date: 09/27/2024 Start Time: 1100 End Time: 1200   Participation:  patient was present and actively participated in the discussion  Type of Therapy:  Group Therapy  Topic:  Healing Flames: Navigating Anger with Compassion  Objective:  Foster self-awareness and promote compassion toward oneself and others when dealing with anger.  Goals:  Help participants understand the underlying emotions and needs fueling anger. Provide coping strategies for healthier emotional expression and anger management.  Summary: This session explored anger as a volcano--an explosion driven by deeper feelings and unmet needs. Participants learned to identify anger triggers and underlying emotions, then practiced coping strategies like deep breathing, physical activity, and journaling. The group discussed healthy ways to manage anger before it escalates, using both personal reflection and shared experiences.  Therapeutic Modalities: Cognitive Behavioral Therapy (CBT): Challenging thoughts that fuel anger. Mindfulness: Increasing awareness of emotions and sensations.   Canna Nickelson O Adalina Dopson, LCSWA 09/27/2024  2:44 PM

## 2024-09-27 NOTE — Progress Notes (Addendum)
 Patient denies SI, HI, AVH. Patient stated they slept Good last night. Scored zero on anxiety and depression. Patient goal is To get discharged ASAP, so I can hike in the woods again, and states will Focus and learn as much as possible to help reach that goal. Patient has been calm, cooperative, and med compliant.     09/27/24 0900  Psych Admission Type (Psych Patients Only)  Admission Status Involuntary  Psychosocial Assessment  Patient Complaints None  Eye Contact Fair  Facial Expression Anxious  Affect Preoccupied  Speech Logical/coherent  Interaction Assertive  Motor Activity Fidgety  Appearance/Hygiene Unremarkable  Behavior Characteristics Cooperative;Appropriate to situation  Mood Anxious  Thought Process  Coherency WDL  Content WDL  Delusions None reported or observed  Perception WDL  Hallucination None reported or observed  Judgment Limited  Confusion None  Danger to Self  Current suicidal ideation? Denies  Danger to Others  Danger to Others None reported or observed
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
## 2024-09-27 NOTE — Group Note (Signed)
 Date:  09/27/2024 Time:  9:55 AM  Group Topic/Focus: Goals and orientation Goals Group:   The focus of this group is to help patients establish daily goals to achieve during treatment and discuss how the patient can incorporate goal setting into their daily lives to aide in recovery. Orientation:   The focus of this group is to educate the patient on the purpose and policies of crisis stabilization and provide a format to answer questions about their admission.  The group details unit policies and expectations of patients while admitted.    Participation Level:  Active  Participation Quality:  Appropriate  Affect:  Appropriate  Cognitive:  Alert  Insight: Appropriate  Engagement in Group:  Engaged  Modes of Intervention:  Orientation  Additional Comments:    Tony Hayes 09/27/2024, 9:55 AM

## 2024-09-27 NOTE — Progress Notes (Signed)
(  Sleep Hours) -8.25 (Any PRNs that were needed, meds refused, or side effects to meds)- Atarax  and nicotine  gum (Any disturbances and when (visitation, over night)- (Concerns raised by the patient)- pt stated he felt he was having withdrawal symptoms from kratom, but the symptoms have improved. Pt stated he still has increased anxiety. (SI/HI/AVH)-denied

## 2024-09-28 MED ORDER — SALINE SPRAY 0.65 % NA SOLN
1.0000 | NASAL | Status: DC | PRN
  Administered 2024-09-29: 1 via NASAL
  Filled 2024-09-28: qty 44
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
## 2024-09-28 NOTE — Group Note (Signed)
 Date:  09/28/2024 Time:  9:19 PM  Group Topic/Focus:  Wrap-Up Group:   The focus of this group is to help patients review their daily goal of treatment and discuss progress on daily workbooks.    Participation Level:  Did Not Attend  Participation Quality:  none  Affect:  n/a  Cognitive:  n/a  Insight: None  Engagement in Group:  None  Modes of Intervention:  none  Additional Comments:   Pt did not attend wrap up group  Tony Hayes 09/28/2024, 9:19 PM

## 2024-09-28 NOTE — Group Note (Signed)
 Recreation Therapy Group Note   Group Topic:Problem Solving  Group Date: 09/28/2024 Start Time: 0935 End Time: 1005 Facilitators: Anya Murphey-McCall, LRT,CTRS Location: [address] Dayroom   Group Topic: Problem Solving  Goal Area(s) Addresses:  Patient will effectively work in a team with other group members. Patient will verbalize importance of using appropriate problem solving techniques.  Patient will identify positive change associated with effective problem solving skills.   Behavioral Response: Engaged  Intervention: Worksheet  Activity: Dentist. Patients worked together to complete a front and back worksheet of brain teasers. Patients had to work through different thoughts and ideas to come up with the correct answer. Once patients finished, LRT and patients went over the answers.    Education: Problem solving, Team Building, Communication  Education Outcome: Acknowledges understanding/In group clarification offered/Needs additional education.    Affect/Mood: Appropriate   Participation Level: Engaged   Participation Quality: Independent   Behavior: Appropriate   Speech/Thought Process: Focused   Insight: Good   Judgement: Good   Modes of Intervention: Worksheet   Patient Response to Interventions:  Engaged   Education Outcome:  In group clarification offered    Clinical Observations/Individualized Feedback: Pt attended and participated in group session.      Plan: Continue to engage patient in RT group sessions 2-3x/week.   Johnney Scarlata-McCall, LRT,CTRS 09/28/2024 1:05 PM

## 2024-09-28 NOTE — Progress Notes (Signed)
 Tour of Duty:  Prentice JINNY Angle, RN, 09/28/24, Tour of Duty: 0700-1900  SI/HI/AVH: Denies  Self-Reported   Mood: Neutral  Anxiety: Endorses Depression: Denies, but Observable Irritability: Denies  Broset  Violence Prevention Guidelines *See Row Information*: Small Violence Risk interventions implemented   LBM  Last BM Date : 09/27/24   Pain: present, PRN provided (see MAR)  Patient Refusals (including Rx): No  >>Shift Summary: Patient observed to be calm but mildly flat and depressed on unit. Patient able to make needs known. Patient observed to engage appropriately with staff and peers. Patient taking medications as prescribed. This shift, PRN medication requested or required. No reported or observed side effects to medication. No reported or observed agitation, aggression, or other acute emotional distress. No reported or observed physical abnormalities or concerns.  Last Vitals  Vitals Weight: 90.3 kg Temp: 97.8 F (36.6 C) Temp Source: Oral Pulse Rate: 78 Resp: 18 BP: 135/84 Patient Position: (not recorded)  Admission Type  Psych Admission Type (Psych Patients Only) Admission Status: Involuntary Date 72 hour document signed : (not recorded) Time 72 hour document signed : (not recorded) Provider Notified (First and Last Name) (see details for LINK to note): (not recorded)   Psychosocial Assessment  Psychosocial Assessment Patient Complaints: None Eye Contact: Fair Facial Expression: Flat Affect: Depressed Speech: Soft Interaction: Guarded, Minimal Motor Activity: Other (Comment) (WDL) Appearance/Hygiene: Unremarkable Behavior Characteristics: Appropriate to situation Mood: Depressed   Aggressive Behavior  Targets: (not recorded)   Thought Process  Thought Process Coherency: Within Defined Limits Content: Within Defined Limits Delusions: None reported or observed Perception: Within Defined Limits Hallucination: None reported or  observed Judgment: Limited Confusion: None  Danger to Self/Others  Danger to Self Current suicidal ideation?: Denies Description of Suicide Plan: (not recorded) Self-Injurious Behavior: (not recorded) Agreement Not to Harm Self: (not recorded) Description of Agreement: (not recorded) Danger to Others: None reported or observed

## 2024-09-28 NOTE — Group Note (Signed)
 Date:  09/28/2024 Time:  6:50 PM  Group Topic/Focus:  Making Healthy Choices:   The focus of this group is to help patients identify negative/unhealthy choices they were using prior to admission and identify positive/healthier diet choices to promote brain health.   Participation Level:  Active  Participation Quality:  Appropriate  Affect:  Appropriate  Cognitive:  Appropriate  Insight: Appropriate  Engagement in Group:  Engaged  Modes of Intervention:  Discussion and Education  Additional Comments:    Juliene CHRISTELLA Huddle 09/28/2024, 6:50 PM

## 2024-09-28 NOTE — Group Note (Signed)
 Date:  09/28/2024 Time:  1:14 PM  Group Topic/Focus: Recreational therapy Word Scramble is a recreational therapy activity that helps adults with mental health conditions improve critical thinking skills. In this activity, participants rearrange scrambled letters to form meaningful words, which encourages problem-solving, decision-making, and concentration. Word Scramble also helps improve attention and memory while reducing stress in a fun and supportive environment. By working individually or in groups, participants practice logical thinking and communication skills, making this activity an effective and engaging tool in recreational therapy.    Participation Level:  Active   Dolores HERO Vela Render 09/28/2024, 1:14 PM

## 2024-09-28 NOTE — BH Assessment (Signed)
(  Sleep Hours) - 7.5 (Any PRNs that were needed, meds refused, or side effects to meds)-  (Any disturbances and when (visitation, over night)- None (Concerns raised by the patient)- None (SI/HI/AVH)- Denies

## 2024-09-28 NOTE — [facility] Group Notes (Signed)
 [facility] Group Notes:  (Nursing/MHT/Case Management/Adjunct)  Date:  09/28/2024  Time:  8:58 PM  Type of Therapy:  Group Therapy  Participation Level:  Active  Participation Quality:  Appropriate  Affect:  Appropriate  Cognitive:  Appropriate  Insight:  Appropriate  Engagement in Group:  Supportive  Modes of Intervention:  Socialization and Support  Summary of Progress/Problems:Pt attended NA  Tony Hayes 09/28/2024, 8:58 PM

## 2024-09-28 NOTE — Plan of Care (Signed)

## 2024-09-28 NOTE — Group Note (Signed)
 Date:  09/28/2024 Time:  11:57 AM  Group Topic/Focus: Goals and orientation Goals Group:   The focus of this group is to help patients establish daily goals to achieve during treatment and discuss how the patient can incorporate goal setting into their daily lives to aide in recovery. Orientation:   The focus of this group is to educate the patient on the purpose and policies of crisis stabilization and provide a format to answer questions about their admission.  The group details unit policies and expectations of patients while admitted.    Participation Level:  Active  Participation Quality:  Appropriate  Affect:  Appropriate  Cognitive:  Alert  Insight: Appropriate  Engagement in Group:  Engaged  Modes of Intervention:  Orientation  Additional Comments:    Tony Hayes 09/28/2024, 11:57 AM

## 2024-09-28 NOTE — Group Note (Signed)
 Date:  09/28/2024 Time:  2:55 PM  Group Topic/Focus: Guess that song This song speaks to the quiet exhaustion of adulthood, where responsibilities pile up and emotions are often hidden behind a practiced smile. It reflects the struggle of feeling lost, overwhelmed, and pressured to appear okay while internally battling anxiety and self-doubt. Through honest lyrics and a steady rhythm, the music reminds listeners that its normal to feel broken sometimes--and that asking for help is not a weakness.    Participation Level:  Minimal  Participation Quality:  Appropriate  Affect:  Appropriate  Cognitive:  Appropriate  Insight: Good and Improving  Engagement in Group:  Engaged  Modes of Intervention:  Education  Additional Comments:    Tony Hayes 09/28/2024, 2:55 PM

## 2024-09-28 NOTE — Plan of Care (Signed)
   Problem: Education: Goal: Emotional status will improve Outcome: Progressing Goal: Mental status will improve Outcome: Progressing

## 2024-09-29 NOTE — [facility] Group Notes (Signed)
 [facility] Group Notes:  (Nursing/MHT/Case Management/Adjunct)  Date:  09/29/2024  Time:  2000  Type of Therapy:  Wrap up group  Participation Level:  Active  Participation Quality:  Appropriate, Attentive, Sharing, and Supportive  Affect:  Appropriate  Cognitive:  Alert  Insight:  Improving  Engagement in Group:  Engaged  Modes of Intervention:  Clarification, Education, and Support  Summary of Progress/Problems: Positive thinking and positive change were discussed.   Lenora Manuelita RAMAN 09/29/2024, 9:13 PM

## 2024-09-29 NOTE — Group Note (Signed)
 Date:  09/29/2024 Time:  5:27 PM  Group Topic/Focus:  Wellness Toolbox:   The focus of this group is to discuss various aspects of wellness, balancing those aspects and exploring ways to increase the ability to experience wellness.  Patients will create a wellness toolbox for use upon discharge.  The nurse played a game with the patients that promotes intellectual wellness.     Participation Level:  Active   Anjali Manzella 09/29/2024, 5:27 PM

## 2024-09-29 NOTE — Progress Notes (Signed)
 Tour of Duty:  Prentice JINNY Angle, RN, 09/29/24, Tour of Duty: 0700-1900  SI/HI/AVH: Denies  Self-Reported   Mood: Positive  Anxiety: Denies Depression: Denies Irritability: Denies  Broset  Violence Prevention Guidelines *See Row Information*: Small Violence Risk interventions implemented   LBM  Last BM Date : 09/28/24   Pain: not present  Patient Refusals (including Rx): No  >>Shift Summary: Patient observed to be mildly boisterous and animated on on unit, but overall pleasant. Patient able to make needs known. Patient observed to engage appropriately with staff and peers. Patient taking medications as prescribed. This shift, no PRN medication requested or required. No reported or observed side effects to medication. No reported or observed agitation, aggression, or other acute emotional distress. No reported or observed physical abnormalities or concerns.  Last Vitals  Vitals Weight: 90.3 kg Temp: 98.1 F (36.7 C) Temp Source: Oral Pulse Rate: 88 Resp: 16 BP: 122/76 Patient Position: (not recorded)  Admission Type  Psych Admission Type (Psych Patients Only) Admission Status: Involuntary Date 72 hour document signed : (not recorded) Time 72 hour document signed : (not recorded) Provider Notified (First and Last Name) (see details for LINK to note): (not recorded)   Psychosocial Assessment  Psychosocial Assessment Patient Complaints: None Eye Contact: Fair Facial Expression: Animated Affect: Preoccupied Speech: Unremarkable Interaction: Superficial Motor Activity: Other (Comment) (WDL) Appearance/Hygiene: Unremarkable Behavior Characteristics: Appropriate to situation, Impulsive Mood: Preoccupied   Aggressive Behavior  Targets: (not recorded)   Thought Process  Thought Process Coherency: Within Defined Limits Content: Within Defined Limits Delusions: None reported or observed Perception: Within Defined Limits Hallucination: None reported or  observed Judgment: Limited Confusion: None  Danger to Self/Others  Danger to Self Current suicidal ideation?: Denies Description of Suicide Plan: (not recorded) Self-Injurious Behavior: (not recorded) Agreement Not to Harm Self: (not recorded) Description of Agreement: (not recorded) Danger to Others: None reported or observed

## 2024-09-29 NOTE — Progress Notes (Signed)
(  Sleep Hours) -5 (Any PRNs that were needed, meds refused, or side effects to meds)- hydroxyzine  (Any disturbances and when (visitation, over night)-no (Concerns raised by the patient)- no (SI/HI/AVH)- denies

## 2024-09-29 NOTE — Plan of Care (Signed)
   Problem: Education: Goal: Emotional status will improve Outcome: Progressing Goal: Mental status will improve Outcome: Progressing   Problem: Activity: Goal: Sleeping patterns will improve Outcome: Progressing

## 2024-09-29 NOTE — Plan of Care (Signed)
   Problem: Education: Goal: Emotional status will improve Outcome: Progressing Goal: Mental status will improve Outcome: Progressing
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
## 2024-09-29 NOTE — Group Note (Signed)
 Date:  09/29/2024 Time:  6:16 PM  Group Topic/Focus:  Group used a non- competitive card game to build mindful listening skills and encourage acceptance and understanding oneself and peers. Patients share, foster empathy, and personal growth in a supportive environment.    Participation Level:  Active  Participation Quality:  Attentive  Affect:  Appropriate  Cognitive:  Alert  Insight: Appropriate  Engagement in Group:  Engaged  Modes of Intervention:  Activity, Rapport Building, Socialization, and Support   Tony Hayes 09/29/2024, 6:16 PM

## 2024-09-29 NOTE — Group Note (Signed)
 Date:  09/29/2024 Time:  4:55 PM  Group Topic/Focus:  Group Topic: Physical Wellness  Description: Group focused on physical wellness and its impact on mental health. Education was provided on healthy habits including sleep, nutrition, hydration, exercise, and self-care. Participants were encouraged to discuss how physical health affects mood, stress levels, and overall functioning. Engagement and participation were appropriate.   Participation Level:  Active  Tony Hayes 09/29/2024, 4:55 PM

## 2024-09-29 NOTE — BH Assessment (Signed)
(  Sleep Hours) - 4.5 (Any PRNs that were needed, meds refused, or side effects to meds)-  (Any disturbances and when (visitation, over night)- None (Concerns raised by the patient)- None (SI/HI/AVH)- Denies

## 2024-09-29 NOTE — B[facility] Group Notes (Signed)
"          [facility] LCSW Group Therapy Note  Date/Time:  09/29/2024 10:10am-11:10am  Type of Therapy and Topic:  Group Therapy:  Healthy and Unhealthy Supports  Participation Level:  Active   Description of Group:  Patients in this group explored the differences between healthy and unhealthy supports.  Patients identified their current healthy supports as well as supports they wished they had but do not currently have.  Psychoeducation was provided about needing to be part of our own support system and there was a discussion about how important it is to participate in self-support in order to have ownership in progress that is made.  Some patients were opposed to the idea of reducing contact with unhealthy supports and this led to a healthy group discussion.  A demonstration was given about how to set boundaries with family and/or friends, which patients expressed was beneficial.    Therapeutic Goals:   1)  compare healthy versus unhealthy supports and identify individuals' current healthy and unhealthy supports 2)  identify additional examples of each and demonstrate commonalities within the group  3)  discuss importance of adding healthy supports and setting boundaries with unhealthy supports  5)  offer mutual support about how to address unhealthy supports  6)  encourage active participation in group    Summary of Patient Progress:  The patient expressed that his mother and father support him 100%.  He wishes that his best friend had not disappeared on him when he got a girlfriend.  He brought up a variety of situations and generated a lot of discussion in the group, was open and respectful throughout.   Therapeutic Modalities:   Psychoeducation Processing  Elgie Crest, LCSW       "

## 2024-09-30 NOTE — Plan of Care (Signed)
   Problem: Education: Goal: Emotional status will improve Outcome: Progressing Goal: Mental status will improve Outcome: Progressing   Problem: Activity: Goal: Sleeping patterns will improve Outcome: Progressing

## 2024-09-30 NOTE — Group Note (Signed)
 Date:  09/30/2024 Time:  6:58 PM  Group Topic/Focus:  Relapse Prevention Planning:   The focus of this group is to define relapse and discuss the need for planning to combat relapse. Natural ways to get dopamine hits as opposed to illicit drugs which cause psychosis.     Participation Level:  Active  Participation Quality:  Attentive  Affect:  Appropriate  Cognitive:  Appropriate  Insight: Appropriate  Engagement in Group:  Engaged  Modes of Intervention:  Discussion and Education  Additional Comments:    Juliene CHRISTELLA Huddle 09/30/2024, 6:58 PM

## 2024-09-30 NOTE — Group Note (Signed)
 Date:  09/30/2024 Time:  9:38 PM  Group Topic/Focus:  Wrap-Up Group:   The focus of this group is to help patients review their daily goal of treatment and discuss progress on daily workbooks.    Participation Level:  Active  Participation Quality:  Appropriate  Affect:  Appropriate  Cognitive:  Appropriate  Insight: Appropriate  Engagement in Group:  Engaged  Modes of Intervention:  Discussion  Additional Comments:   Pt states that he participated in groups and attended activities. Pt states that he has conflicting feelings about his d/c tomorrow but is excited to apply what he has learned towards his life. Pt had a visit with his father and called his mother.   Tony Tony Hayes 09/30/2024, 9:38 PM

## 2024-09-30 NOTE — Group Note (Signed)
 Date:  09/30/2024 Time:  10:19 AM  Group Topic/Focus:  Goals Group:   The focus of this group is to help patients establish daily goals to achieve during treatment and discuss how the patient can incorporate goal setting into their daily lives to aide in recovery.    Participation Level:  Did Not Attend  Participation Quality:  Appropriate  Affect:  Appropriate  Cognitive:  Appropriate  Insight: Appropriate  Engagement in Group:  Improving  Modes of Intervention:  Discussion  Additional Comments:  Pt was an active listener during group  Arul Farabee A Nysa Sarin 09/30/2024, 10:19 AM

## 2024-09-30 NOTE — Progress Notes (Signed)
(  Sleep Hours) -7.75 (Any PRNs that were needed, meds refused, or side effects to meds)- no (Any disturbances and when (visitation, over night)-no (Concerns raised by the patient)- no (SI/HI/AVH)- denies
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
## 2024-09-30 NOTE — Plan of Care (Signed)
   Problem: Education: Goal: Emotional status will improve Outcome: Progressing Goal: Mental status will improve Outcome: Progressing

## 2024-09-30 NOTE — Group Note (Signed)
 Date:  09/30/2024 Time:  11:12 AM  Group Topic/Focus:  Dimensions of Wellness:   The focus of this group is to introduce the topic of wellness and discuss the role each dimension of wellness plays in total health. Emotional Education:   The focus of this group is to discuss what feelings/emotions are, and how they are experienced.    Participation Level:  Active  Participation Quality:  Appropriate  Affect:  Appropriate  Cognitive:  Appropriate  Insight: Appropriate  Engagement in Group:  Improving  Modes of Intervention:  Education    Tony Hayes 09/30/2024, 11:12 AM

## 2024-09-30 NOTE — Group Note (Signed)
 Date:  09/30/2024 Time:  4:16 PM  Group Topic/Focus:  Wellness Toolbox:   The focus of this group is to discuss various aspects of wellness, balancing those aspects and exploring ways to increase the ability to experience wellness.  Patients will create a wellness toolbox for use upon discharge.    Participation Level:  Active  Participation Quality:  Appropriate and Attentive  Affect:  Appropriate  Cognitive:  Alert and Appropriate  Insight: Appropriate and Good  Engagement in Group:  Engaged  Modes of Intervention:  Discussion and Education    Tony Hayes 09/30/2024, 4:16 PM

## 2024-10-01 MED ORDER — FLUOXETINE HCL 20 MG PO CAPS: 20.0000 mg | ORAL_CAPSULE | Freq: Every day | ORAL | 0 refills | Status: AC

## 2024-10-01 MED ORDER — LAMOTRIGINE 200 MG PO TABS: 200.0000 mg | ORAL_TABLET | Freq: Every day | ORAL | 0 refills | Status: AC

## 2024-10-01 MED ORDER — ARIPIPRAZOLE 5 MG PO TABS: 5.0000 mg | ORAL_TABLET | Freq: Every day | ORAL | 0 refills | Status: AC

## 2024-10-01 MED ORDER — NICOTINE POLACRILEX 2 MG MT GUM: 2.0000 mg | CHEWING_GUM | OROMUCOSAL | 0 refills | Status: AC | PRN

## 2024-10-01 MED ORDER — HYDROXYZINE HCL 25 MG PO TABS: 25.0000 mg | ORAL_TABLET | Freq: Three times a day (TID) | ORAL | 0 refills | Status: AC | PRN

## 2024-10-01 NOTE — [facility] Suicide Risk Assessment (Signed)
 [facility] Discharge Suicide Risk Assessment   Principal Problem: Bipolar 1 disorder, mixed, moderate (HCC) Discharge Diagnoses: Principal Problem:   Bipolar 1 disorder, mixed, moderate (HCC) Active Problems:   Cannabis use disorder, moderate, dependence (HCC)  During the patient's hospitalization, patient had extensive initial psychiatric evaluation, and follow-up psychiatric evaluations every day.  Psychiatric diagnoses provided upon initial assessment: Bipolar 1 disorder, mixed, moderate (HCC) Active Problems:   Cannabis use disorder, moderate, dependence (HCC)  Patient's psychiatric medications were adjusted on admission: He was started on Abilify .  He was crossed tappered off Paxil  and on Prozac .  His Lamictal  was decreased.  During the hospitalization, other adjustments were made to the patient's psychiatric medication regimen: Cross taper off Paxil  and on to Prozac  was completed.  Gradually, patient started adjusting to milieu.   Patient's care was discussed during the interdisciplinary team meeting every day during the hospitalization.  The patient is not having side effects to prescribed psychiatric medication.  The patient reports their target psychiatric symptoms of depression, anxiety, and aggression responded well to the psychiatric medications, and the patient reports overall benefit other psychiatric hospitalization. Supportive psychotherapy was provided to the patient. The patient also participated in regular group therapy while admitted.   Labs were reviewed with the patient, and abnormal results were discussed with the patient.  The patient denied having suicidal thoughts more than 48 hours prior to discharge.  Patient denies having homicidal thoughts.  Patient denies having auditory hallucinations.  Patient denies any visual hallucinations.  Patient denies having paranoid thoughts.  The patient is able to verbalize their individual safety plan to this provider.  It is  recommended to the patient to continue psychiatric medications as prescribed, after discharge from the hospital.    It is recommended to the patient to follow up with your outpatient psychiatric provider and PCP.  Discussed with the patient, the impact of alcohol, drugs, tobacco have been there overall psychiatric and medical wellbeing, and total abstinence from substance use was recommended the patient.  Total Time spent with patient: 20 minutes  Musculoskeletal: Strength & Muscle Tone: within normal limits Gait & Station: normal Patient leans: N/A  Psychiatric Specialty Exam  Presentation  General Appearance:  Appropriate for Environment; Casual  Eye Contact: Good  Speech: Clear and Coherent; Normal Rate  Speech Volume: Normal  Handedness: Right   Mood and Affect  Mood: Euthymic  Duration of Depression Symptoms: N/A  Affect: Congruent; Appropriate   Thought Process  Thought Processes: Coherent; Linear  Descriptions of Associations:Intact  Orientation:Full (Time, Place and Person)  Thought Content:Logical; WDL  History of Schizophrenia/Schizoaffective disorder:No  Duration of Psychotic Symptoms:N/A  Hallucinations:Hallucinations: None  Ideas of Reference:None  Suicidal Thoughts:Suicidal Thoughts: No  Homicidal Thoughts:Homicidal Thoughts: No   Sensorium  Memory: Immediate Good; Recent Good  Judgment: Fair  Insight: Fair   Art Therapist  Concentration: Good  Attention Span: Good  Recall: Good  Fund of Knowledge: Good  Language: Good   Psychomotor Activity  Psychomotor Activity: Psychomotor Activity: Normal   Assets  Assets: Communication Skills; Desire for Improvement; Resilience; Physical Health   Sleep  Sleep: Sleep: Good  Estimated Sleeping Duration (Last 24 Hours): 7.00-7.50 hours  Physical Exam: Physical Exam Vitals and nursing note reviewed.  Constitutional:      General: He is not in acute  distress.    Appearance: Normal appearance. He is normal weight. He is not ill-appearing or toxic-appearing.  HENT:     Head: Normocephalic and atraumatic.  Pulmonary:  Effort: Pulmonary effort is normal.  Musculoskeletal:        General: Normal range of motion.  Neurological:     General: No focal deficit present.     Mental Status: He is alert.    Review of Systems  Respiratory:  Negative for cough and shortness of breath.   Cardiovascular:  Negative for chest pain.  Gastrointestinal:  Negative for abdominal pain, constipation, diarrhea, nausea and vomiting.  Neurological:  Negative for dizziness, weakness and headaches.  Psychiatric/Behavioral:  Negative for depression, hallucinations and suicidal ideas. The patient is not nervous/anxious.    Blood pressure 123/83, pulse 83, temperature 97.9 F (36.6 C), temperature source Oral, resp. rate 16, height 5' 10 (1.778 m), weight 90.3 kg, SpO2 100%. Body mass index is 28.55 kg/m.  Mental Status Per Nursing Assessment::   On Admission:  NA  Demographic Factors:  Male  Loss Factors: NA  Historical Factors: Impulsivity  Risk Reduction Factors:   Living with another person, especially a relative and Positive social support  Continued Clinical Symptoms:  Alcohol/Substance Abuse/Dependencies More than one psychiatric diagnosis Previous Psychiatric Diagnoses and Treatments Medical Diagnoses and Treatments/Surgeries  Cognitive Features That Contribute To Risk:  None    Suicide Risk:  Minimal: No identifiable suicidal ideation.  Patients presenting with no risk factors but with morbid ruminations; may be classified as minimal risk based on the severity of the depressive symptoms   Follow-up Information     [facility]. Go on 10/09/2024.   Specialty: Behavioral Health Why: You have an appointment for therapy services on 10/09/24 at 10:00 am, in person.  You also have appointment on 11/02/24 at  11:30 am for medication management services.  * Please call to cancel if you do not wish to attend. Contact information: [address], [address] [place]  [address] [phone]        [facility] Go on 10/17/2024.   Why: You have an appointment for medication management services on 10/17/24 at 2:00 pm, in person. Contact information: [address] [place] [address] [phone]                 Plan Of Care/Follow-up recommendations:  Activity: as tolerated  Diet: heart healthy  Other: -Follow-up with your outpatient psychiatric provider -instructions on appointment date, time, and address (location) are provided to you in discharge paperwork.  -Take your psychiatric medications as prescribed at discharge - instructions are provided to you in the discharge paperwork  -Follow-up with outpatient primary care doctor and other specialists -for management of chronic medical disease, including: Routine Care.  -Testing: Follow-up with outpatient provider for abnormal lab results: None  -Recommend abstinence from alcohol, tobacco, and other illicit drug use at discharge.   -If your psychiatric symptoms recur, worsen, or if you have side effects to your psychiatric medications, call your outpatient psychiatric provider, 911, [phone] or go to the nearest emergency department.  -If suicidal thoughts recur, call your outpatient psychiatric provider, 911, [phone] or go to the nearest emergency department.   Marsa GORMAN Rosser, DO 10/01/2024, 7:58 AM
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
## 2024-10-01 NOTE — Group Note (Signed)
 Date:  10/01/2024 Time:  10:16 AM  Group Topic/Focus:  Goals Group:   The focus of this group is to help patients establish daily goals to achieve during treatment and discuss how the patient can incorporate goal setting into their daily lives to aide in recovery.    Participation Level:  Active  Participation Quality:  Appropriate  Affect:  Appropriate  Cognitive:  Appropriate  Insight: Appropriate  Engagement in Group:  Engaged  Modes of Intervention:  Discussion  Additional Comments:  pt is being discharged,plans to workout so he can burn off the food he ate here and go eat sushi  Nat Rummer 10/01/2024, 10:16 AM

## 2024-10-01 NOTE — Progress Notes (Signed)
" °  [facility] Adult Case Management Discharge Plan :  Will you be returning to the same living situation after discharge:  Yes,  pt returning home at discharge At discharge, do you have transportation home?: Yes,  pt will be picked up by parents at 10AM Do you have the ability to pay for your medications: Yes,  pt has active health insurance coverage  Release of information consent forms completed and in the chart;  Patient's signature needed at discharge.  Patient to Follow up at:  Follow-up Information     [facility]. Go on 10/09/2024.   Specialty: Behavioral Health Why: You have an appointment for therapy services on 10/09/24 at 10:00 am, in person.  You also have appointment on 11/02/24 at 11:30 am for medication management services.  * Please call to cancel if you do not wish to attend. Contact information: [address], [address] [place]  [address] [phone]        [facility] Go on 10/17/2024.   Why: You have an appointment for medication management services on 10/17/24 at 2:00 pm, in person. Contact information: [address] [place] [address] [phone]                 Next level of care provider has access to [facility] Link:no  Safety Planning and Suicide Prevention discussed: Yes,  Mother, Sidharth Leverette [phone]     Has patient been referred to the Quitline?: Patient does not use tobacco/nicotine  products pt is a former smoker   Patient has been referred for addiction treatment: No known substance use disorder.  Jenkins LULLA Primer, LCSWA 10/01/2024, 8:49 AM "

## 2024-10-01 NOTE — Progress Notes (Signed)
 Pt discharged to lobby. Pt was stable and appreciative at that time. All papers and prescriptions were given and valuables returned. Verbal understanding expressed. Denies SI/HI and A/VH. Pt given opportunity to express concerns and ask questions.

## 2024-10-09 ENCOUNTER — Ambulatory Visit: Admitting: Mental Health

## 2024-10-09 ENCOUNTER — Ambulatory Visit: Admitting: Family Medicine

## 2024-10-09 VITALS — BP 118/68 | HR 76 | Temp 98.3°F | Ht 70.0 in | Wt 200.0 lb

## 2024-10-09 DIAGNOSIS — F339 Major depressive disorder, recurrent, unspecified: Secondary | ICD-10-CM

## 2024-10-09 DIAGNOSIS — R0981 Nasal congestion: Secondary | ICD-10-CM | POA: Diagnosis not present

## 2024-10-09 MED ORDER — FLUTICASONE PROPIONATE 50 MCG/ACT NA SUSP: 2.0000 | Freq: Every day | NASAL | 6 refills | Status: AC
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
## 2024-10-09 NOTE — Assessment & Plan Note (Signed)
 Appears to be some leftward nasal septal deviation. I will give him a 1 month trial of a nasal steroid spray. if this does not improve symptoms, would consider ENT referral.

## 2024-10-09 NOTE — Assessment & Plan Note (Signed)
 Continue to follow with psychiatry. No longer suicidal. Prior SI likely triggered by kratom use.

## 2024-10-24 ENCOUNTER — Ambulatory Visit (HOSPITAL_BASED_OUTPATIENT_CLINIC_OR_DEPARTMENT_OTHER): Admitting: Orthopaedic Surgery

## 2024-11-02 ENCOUNTER — Ambulatory Visit: Admitting: Physician Assistant

## 2025-02-19 ENCOUNTER — Encounter: Admitting: Family Medicine
